# Patient Record
Sex: Female | Born: 1990 | Race: White | Hispanic: No | Marital: Single | Smoking: Former smoker
Health system: Southern US, Community
[De-identification: ages and names within clinical notes are randomized; demographics above are authoritative.]

## PROBLEM LIST (undated history)

## (undated) DIAGNOSIS — F419 Anxiety disorder, unspecified: Secondary | ICD-10-CM

## (undated) DIAGNOSIS — F909 Attention-deficit hyperactivity disorder, unspecified type: Secondary | ICD-10-CM

## (undated) DIAGNOSIS — N809 Endometriosis, unspecified: Secondary | ICD-10-CM

---
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
## 2013-02-28 ENCOUNTER — Emergency Department
Admission: EM | Admit: 2013-02-28 | Discharge: 2013-02-28 | Discharge status: Absent without leave | Payer: Self-pay | Source: Home / Self Care | Attending: Family Medicine | Admitting: Family Medicine

## 2013-02-28 ENCOUNTER — Encounter: Payer: Self-pay | Admitting: Emergency Medicine

## 2013-02-28 DIAGNOSIS — R21 Rash and other nonspecific skin eruption: Secondary | ICD-10-CM

## 2013-02-28 NOTE — ED Provider Notes (Signed)
History    CSN: [id number] Arrival date & time 02/28/13  4098  First MD Initiated Contact with Patient 02/28/13 1907     Chief Complaint  Patient presents with  . Rash      HPI Comments: Patient complains of onset of a pruritic rash on her feet about 3 to 4 weeks ago.  Original lesions have persisted, and she has gradually developed new lesions.  She has multiple other complaints, including fatigue and approximately 15 pound weight loss.  She does not feel acutely ill.  The history is provided by the patient.   History reviewed. No pertinent past medical history. History reviewed. No pertinent past surgical history. Family History  Problem Relation Age of Onset  . Thyroid disease Mother   . Hypertension Father    History  Substance Use Topics  . Smoking status: Current Every Day Smoker -- 0.50 packs/day for 2 years  . Smokeless tobacco: Not on file  . Alcohol Use: No   OB History   Grav Para Term Preterm Abortions TAB SAB Ect Mult Living                 Review of Systems  Constitutional: Positive for fever, chills, activity change, fatigue and unexpected weight change.  HENT: Negative for ear pain, congestion, mouth sores, trouble swallowing and neck stiffness.   Eyes: Positive for discharge. Negative for photophobia and redness.  Respiratory: Positive for shortness of breath. Negative for cough.   Cardiovascular: Negative.   Gastrointestinal: Positive for nausea, abdominal pain and diarrhea. Negative for vomiting and blood in stool.  Genitourinary: Positive for dysuria and frequency. Negative for hematuria, flank pain, vaginal bleeding, vaginal discharge, difficulty urinating, menstrual problem and pelvic pain.  Musculoskeletal: Positive for myalgias.       Joint stiffness  Neurological: Positive for headaches.  Hematological: Negative for adenopathy.    Allergies  Penicillins  Home Medications  No current outpatient prescriptions on file. BP 111/74  Pulse 71   Temp(Src) 98.3 F (36.8 C) (Oral)  Ht 5\' 1"  (1.549 m)  Wt 106 lb (48.081 kg)  BMI 20.04 kg/m2  SpO2 100%  LMP 01/29/2013 Physical Exam  Constitutional: She is oriented to person, place, and time. She appears well-developed and well-nourished. No distress.  HENT:  Head: Normocephalic.  Nose: Nose normal.  Mouth/Throat: Oropharynx is clear and moist.  Eyes: Conjunctivae are normal. Pupils are equal, round, and reactive to light.  Neck: Neck supple.  Cardiovascular: Normal rate, regular rhythm and normal heart sounds.   Pulmonary/Chest: Breath sounds normal.  Abdominal: Bowel sounds are normal. There is no tenderness.  Musculoskeletal: She exhibits no edema.       Feet:  Lymphadenopathy:    She has no cervical adenopathy.  Neurological: She is alert and oriented to person, place, and time.  Skin: Rash noted. Rash is maculopapular.     Patient's extremities have widely scattered non-specific appearing erythematous macules about 3mm to 5mm dia.  On the plantar surface of her left foot are two slightly raised erythematous macules about 8mm dia and 12mm dia that are tender to palpation.  On her upper anterior thighs are two lightly erythematous macules measuring about 4cm and 5cm dia as noted on diagram.  On her right posterior shoulder are two macular tender erythematous lesions about 8mm and 10mm dia.    ED Course  Procedures  none    1. Rash and nonspecific skin eruption.  Patient has a constellation of symptoms including fever/chills/sweats, 15  pound weight loss, myalgias, dizziness, fatigue, headaches, GI symptoms, etc.  Concern for possible secondary syphilis.        MDM   Because she may likely need an extensive evaluation, including skin biopsy, will refer to dermatology as soon as possible  Lattie Haw, MD 02/28/13 2023

## 2013-02-28 NOTE — ED Notes (Signed)
Red, itchy rash on feet, thighs, back x 3 weeks

## 2013-03-01 ENCOUNTER — Telehealth: Payer: Self-pay | Admitting: *Deleted

## 2014-09-14 ENCOUNTER — Emergency Department (HOSPITAL_BASED_OUTPATIENT_CLINIC_OR_DEPARTMENT_OTHER)
Admission: EM | Admit: 2014-09-14 | Discharge: 2014-09-14 | Disposition: A | Discharge status: Home / Self Care | Payer: No Typology Code available for payment source | Attending: Emergency Medicine | Admitting: Emergency Medicine

## 2014-09-14 ENCOUNTER — Encounter (HOSPITAL_BASED_OUTPATIENT_CLINIC_OR_DEPARTMENT_OTHER): Payer: Self-pay | Admitting: *Deleted

## 2014-09-14 ENCOUNTER — Emergency Department (HOSPITAL_BASED_OUTPATIENT_CLINIC_OR_DEPARTMENT_OTHER): Payer: No Typology Code available for payment source

## 2014-09-14 DIAGNOSIS — F419 Anxiety disorder, unspecified: Secondary | ICD-10-CM | POA: Insufficient documentation

## 2014-09-14 DIAGNOSIS — Z79899 Other long term (current) drug therapy: Secondary | ICD-10-CM | POA: Insufficient documentation

## 2014-09-14 DIAGNOSIS — Y998 Other external cause status: Secondary | ICD-10-CM | POA: Insufficient documentation

## 2014-09-14 DIAGNOSIS — Z720 Tobacco use: Secondary | ICD-10-CM | POA: Insufficient documentation

## 2014-09-14 DIAGNOSIS — Z880 Allergy status to penicillin: Secondary | ICD-10-CM | POA: Insufficient documentation

## 2014-09-14 DIAGNOSIS — S299XXA Unspecified injury of thorax, initial encounter: Secondary | ICD-10-CM | POA: Insufficient documentation

## 2014-09-14 DIAGNOSIS — M546 Pain in thoracic spine: Secondary | ICD-10-CM

## 2014-09-14 DIAGNOSIS — Y92410 Unspecified street and highway as the place of occurrence of the external cause: Secondary | ICD-10-CM | POA: Insufficient documentation

## 2014-09-14 DIAGNOSIS — Y9389 Activity, other specified: Secondary | ICD-10-CM | POA: Insufficient documentation

## 2014-09-14 HISTORY — DX: Anxiety disorder, unspecified: F41.9

## 2014-09-14 MED ORDER — CYCLOBENZAPRINE HCL 5 MG PO TABS: 5.0000 mg | ORAL_TABLET | Freq: Two times a day (BID) | ORAL | Status: DC | PRN
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
## 2014-09-14 NOTE — Discharge Instructions (Signed)
Back Pain, Adult Low back pain is very common. About 1 in 5 people have back pain.The cause of low back pain is rarely dangerous. The pain often gets better over time.About half of people with a sudden onset of back pain feel better in just 2 weeks. About 8 in 10 people feel better by 6 weeks.  CAUSES Some common causes of back pain include:  Strain of the muscles or ligaments supporting the spine.  Wear and tear (degeneration) of the spinal discs.  Arthritis.  Direct injury to the back. DIAGNOSIS Most of the time, the direct cause of low back pain is not known.However, back pain can be treated effectively even when the exact cause of the pain is unknown.Answering your caregiver's questions about your overall health and symptoms is one of the most accurate ways to make sure the cause of your pain is not dangerous. If your caregiver needs more information, he or she may order lab work or imaging tests (X-rays or MRIs).However, even if imaging tests show changes in your back, this usually does not require surgery. HOME CARE INSTRUCTIONS For many people, back pain returns.Since low back pain is rarely dangerous, it is often a condition that people can learn to manageon their own.   Remain active. It is stressful on the back to sit or stand in one place. Do not sit, drive, or stand in one place for more than 30 minutes at a time. Take short walks on level surfaces as soon as pain allows.Try to increase the length of time you walk each day.  Do not stay in bed.Resting more than 1 or 2 days can delay your recovery.  Do not avoid exercise or work.Your body is made to move.It is not dangerous to be active, even though your back may hurt.Your back will likely heal faster if you return to being active before your pain is gone.  Pay attention to your body when you bend and lift. Many people have less discomfortwhen lifting if they bend their knees, keep the load close to their bodies,and  avoid twisting. Often, the most comfortable positions are those that put less stress on your recovering back.  Find a comfortable position to sleep. Use a firm mattress and lie on your side with your knees slightly bent. If you lie on your back, put a pillow under your knees.  Only take over-the-counter or prescription medicines as directed by your caregiver. Over-the-counter medicines to reduce pain and inflammation are often the most helpful.Your caregiver may prescribe muscle relaxant drugs.These medicines help dull your pain so you can more quickly return to your normal activities and healthy exercise.  Put ice on the injured area.  Put ice in a plastic bag.  Place a towel between your skin and the bag.  Leave the ice on for 15-20 minutes, 03-04 times a day for the first 2 to 3 days. After that, ice and heat may be alternated to reduce pain and spasms.  Ask your caregiver about trying back exercises and gentle massage. This may be of some benefit.  Avoid feeling anxious or stressed.Stress increases muscle tension and can worsen back pain.It is important to recognize when you are anxious or stressed and learn ways to manage it.Exercise is a great option. SEEK MEDICAL CARE IF:  You have pain that is not relieved with rest or medicine.  You have pain that does not improve in 1 week.  You have new symptoms.  You are generally not feeling well. SEEK   IMMEDIATE MEDICAL CARE IF:   You have pain that radiates from your back into your legs.  You develop new bowel or bladder control problems.  You have unusual weakness or numbness in your arms or legs.  You develop nausea or vomiting.  You develop abdominal pain.  You feel faint. Document Released: 07/28/2005 Document Revised: 01/27/2012 Document Reviewed: 11/29/2013 [employer] Patient Information 2015 [employer]. This information is not intended to replace advice given to you by your health care provider. Make sure you  discuss any questions you have with your health care provider.  

## 2014-12-10 ENCOUNTER — Emergency Department (HOSPITAL_BASED_OUTPATIENT_CLINIC_OR_DEPARTMENT_OTHER)
Admission: EM | Admit: 2014-12-10 | Discharge: 2014-12-10 | Disposition: A | Discharge status: Home / Self Care | Payer: Self-pay | Attending: Emergency Medicine | Admitting: Emergency Medicine

## 2014-12-10 ENCOUNTER — Emergency Department (HOSPITAL_BASED_OUTPATIENT_CLINIC_OR_DEPARTMENT_OTHER): Payer: Self-pay

## 2014-12-10 ENCOUNTER — Encounter (HOSPITAL_BASED_OUTPATIENT_CLINIC_OR_DEPARTMENT_OTHER): Payer: Self-pay | Admitting: Emergency Medicine

## 2014-12-10 DIAGNOSIS — Z880 Allergy status to penicillin: Secondary | ICD-10-CM | POA: Insufficient documentation

## 2014-12-10 DIAGNOSIS — Z79899 Other long term (current) drug therapy: Secondary | ICD-10-CM | POA: Insufficient documentation

## 2014-12-10 DIAGNOSIS — H539 Unspecified visual disturbance: Secondary | ICD-10-CM | POA: Insufficient documentation

## 2014-12-10 DIAGNOSIS — Z720 Tobacco use: Secondary | ICD-10-CM | POA: Insufficient documentation

## 2014-12-10 DIAGNOSIS — Z3202 Encounter for pregnancy test, result negative: Secondary | ICD-10-CM | POA: Insufficient documentation

## 2014-12-10 DIAGNOSIS — M549 Dorsalgia, unspecified: Secondary | ICD-10-CM | POA: Insufficient documentation

## 2014-12-10 DIAGNOSIS — F419 Anxiety disorder, unspecified: Secondary | ICD-10-CM | POA: Insufficient documentation

## 2014-12-10 DIAGNOSIS — K529 Noninfective gastroenteritis and colitis, unspecified: Secondary | ICD-10-CM | POA: Insufficient documentation

## 2014-12-10 DIAGNOSIS — R0602 Shortness of breath: Secondary | ICD-10-CM | POA: Insufficient documentation

## 2014-12-10 DIAGNOSIS — R109 Unspecified abdominal pain: Secondary | ICD-10-CM

## 2014-12-10 HISTORY — DX: Attention-deficit hyperactivity disorder, unspecified type: F90.9

## 2014-12-10 LAB — CBC WITH DIFFERENTIAL/PLATELET
BASOS ABS: 0 10*3/uL (ref 0.0–0.1)
Basophils Relative: 0 % (ref 0–1)
Eosinophils Absolute: 0.1 10*3/uL (ref 0.0–0.7)
Eosinophils Relative: 1 % (ref 0–5)
HCT: 41.7 % (ref 36.0–46.0)
HEMOGLOBIN: 14 g/dL (ref 12.0–15.0)
LYMPHS PCT: 14 % (ref 12–46)
Lymphs Abs: 1.4 10*3/uL (ref 0.7–4.0)
MCH: 32.4 pg (ref 26.0–34.0)
MCHC: 33.6 g/dL (ref 30.0–36.0)
MCV: 96.5 fL (ref 78.0–100.0)
Monocytes Absolute: 0.4 10*3/uL (ref 0.1–1.0)
Monocytes Relative: 4 % (ref 3–12)
NEUTROS ABS: 8.2 10*3/uL — AB (ref 1.7–7.7)
Neutrophils Relative %: 81 % — ABNORMAL HIGH (ref 43–77)
Platelets: 281 10*3/uL (ref 150–400)
RBC: 4.32 MIL/uL (ref 3.87–5.11)
RDW: 12.2 % (ref 11.5–15.5)
WBC: 10.1 10*3/uL (ref 4.0–10.5)

## 2014-12-10 LAB — COMPREHENSIVE METABOLIC PANEL
ALBUMIN: 4.4 g/dL (ref 3.5–5.0)
ALT: 8 U/L — AB (ref 14–54)
AST: 22 U/L (ref 15–41)
Alkaline Phosphatase: 82 U/L (ref 38–126)
Anion gap: 7 (ref 5–15)
BUN: 16 mg/dL (ref 6–20)
CO2: 22 mmol/L (ref 22–32)
Calcium: 8.4 mg/dL — ABNORMAL LOW (ref 8.9–10.3)
Chloride: 107 mmol/L (ref 101–111)
Creatinine, Ser: 0.57 mg/dL (ref 0.44–1.00)
GFR calc non Af Amer: >60 mL/min (ref >60)
GLUCOSE: 86 mg/dL (ref 70–99)
Potassium: 4.1 mmol/L (ref 3.5–5.1)
SODIUM: 136 mmol/L (ref 135–145)
TOTAL PROTEIN: 7.7 g/dL (ref 6.5–8.1)
Total Bilirubin: 0.5 mg/dL (ref 0.3–1.2)

## 2014-12-10 LAB — CBG MONITORING, ED: Glucose-Capillary: 73 mg/dL (ref 70–99)

## 2014-12-10 LAB — LIPASE, BLOOD: Lipase: 21 U/L — ABNORMAL LOW (ref 22–51)

## 2014-12-10 LAB — PREGNANCY, URINE: Preg Test, Ur: NEGATIVE

## 2014-12-10 MED ORDER — SODIUM CHLORIDE 0.9 % IV BOLUS (SEPSIS)
1000.0000 mL | Freq: Once | INTRAVENOUS | Status: AC
  Administered 2014-12-10: 1000 mL via INTRAVENOUS

## 2014-12-10 MED ORDER — PROMETHAZINE HCL 25 MG PO TABS: 25.0000 mg | ORAL_TABLET | Freq: Four times a day (QID) | ORAL | Status: DC | PRN

## 2014-12-10 MED ORDER — ONDANSETRON HCL 4 MG/2ML IJ SOLN
4.0000 mg | Freq: Once | INTRAMUSCULAR | Status: AC
  Administered 2014-12-10: 4 mg via INTRAVENOUS
  Filled 2014-12-10: qty 2

## 2014-12-10 MED ORDER — IOHEXOL 300 MG/ML  SOLN
50.0000 mL | Freq: Once | INTRAMUSCULAR | Status: AC | PRN
Start: 2014-12-10 — End: 2014-12-10
  Administered 2014-12-10: 50 mL via ORAL

## 2014-12-10 MED ORDER — LOPERAMIDE HCL 2 MG PO TABS: 2.0000 mg | ORAL_TABLET | Freq: Four times a day (QID) | ORAL | Status: DC | PRN

## 2014-12-10 MED ORDER — FENTANYL CITRATE (PF) 100 MCG/2ML IJ SOLN
25.0000 ug | Freq: Once | INTRAMUSCULAR | Status: AC
  Administered 2014-12-10: 25 ug via INTRAVENOUS
  Filled 2014-12-10: qty 2

## 2014-12-10 MED ORDER — IOHEXOL 300 MG/ML  SOLN
100.0000 mL | Freq: Once | INTRAMUSCULAR | Status: AC | PRN
  Administered 2014-12-10: 100 mL via INTRAVENOUS

## 2014-12-10 MED ORDER — SODIUM CHLORIDE 0.9 % IV SOLN
INTRAVENOUS | Status: DC
  Administered 2014-12-10: 16:00:00 via INTRAVENOUS

## 2014-12-10 NOTE — ED Provider Notes (Signed)
CSN: [id number]     Arrival date & time 12/10/14  1442 History  This chart was scribed for Vanetta Mulders, MD by Roxy Cedar, ED Scribe. This patient was seen in room MH10/MH10 and the patient's care was started at [facility] PM.   Chief Complaint  Patient presents with  . Abdominal Pain   Patient is a 24 y.o. female presenting with abdominal pain. The history is provided by the patient. No language interpreter was used.  Abdominal Pain Pain location:  Periumbilical Pain quality: aching, cramping and sharp   Pain radiates to:  Back Pain severity:  Moderate Onset quality:  Gradual Timing:  Intermittent Chronicity:  New Relieved by:  None tried Worsened by:  Nothing tried Associated symptoms: chills, diarrhea, nausea, shortness of breath, vaginal bleeding (menstrual period) and vomiting   Associated symptoms: no chest pain, no dysuria, no fever and no sore throat    HPI Comments: Suzanne Horton is a 24 y.o. female with a PMHx of anxiety and ADHD, who presents to the Emergency Department complaining of moderate nausea onset this morning at 9PM and onset of emesis at 12PM today. Patient has had 4 episodes of emesis and one episode of diarrhea. Patient reports associated periumbilical pain that intermittently radiates to back. She describes the pain to be sharp. She currently rates her pain to be 5/10 and at worst, 10/10. She denies associated fever, hemoptysis, or hematemesis. Patient denies any sick contacts. Patient states that she began her menstrual cycle yesterday but denies current pain being associated with her cycle.  Past Medical History  Diagnosis Date  . Anxiety   . ADHD (attention deficit hyperactivity disorder)    History reviewed. No pertinent past surgical history. Family History  Problem Relation Age of Onset  . Thyroid disease Mother   . Hypertension Father    History  Substance Use Topics  . Smoking status: Current Every Day Smoker -- 0.25 packs/day for 2 years  .  Smokeless tobacco: Not on file  . Alcohol Use: Yes     Comment: "couple of shots every night"   OB History    No data available     Review of Systems  Constitutional: Positive for chills. Negative for fever.  HENT: Negative for congestion, rhinorrhea and sore throat.   Eyes: Positive for visual disturbance (blurred vision).  Respiratory: Positive for shortness of breath.   Cardiovascular: Negative for chest pain.  Gastrointestinal: Positive for nausea, vomiting, abdominal pain and diarrhea.  Genitourinary: Positive for vaginal bleeding (menstrual period). Negative for dysuria.  Musculoskeletal: Positive for back pain.  Skin: Negative for rash.  Neurological: Negative for weakness and headaches.  Hematological: Does not bruise/bleed easily.  Psychiatric/Behavioral: Negative for confusion.   Allergies  Ativan and Penicillins  Home Medications   Prior to Admission medications   Medication Sig Start Date End Date Taking? Authorizing Provider  ALPRAZolam (XANAX) 0.25 MG tablet Take 0.25 mg by mouth 2 (two) times daily as needed for anxiety.    Historical Provider, MD  citalopram (CELEXA) 20 MG tablet Take 20 mg by mouth daily.    Historical Provider, MD  cyclobenzaprine (FLEXERIL) 5 MG tablet Take 1 tablet (5 mg total) by mouth 2 (two) times daily as needed. 09/14/14   Teressa Lower, NP  loperamide (IMODIUM A-D) 2 MG tablet Take 1 tablet (2 mg total) by mouth 4 (four) times daily as needed for diarrhea or loose stools. 12/10/14   Vanetta Mulders, MD  promethazine (PHENERGAN) 25 MG tablet Take 1 tablet (  25 mg total) by mouth every 6 (six) hours as needed for nausea or vomiting. 12/10/14   Vanetta MuldersScott Jana Swartzlander, MD   Triage Vitals: BP 114/65 mmHg  Pulse 70  Temp(Src) 97.8 F (36.6 C) (Oral)  Resp 16  Ht 5\' 2"  (1.575 m)  Wt 128 lb (58.06 kg)  BMI 23.41 kg/m2  SpO2 100%  LMP 12/09/2014 (Exact Date)  Physical Exam  Constitutional: She is oriented to person, place, and time. She appears  well-developed and well-nourished.  HENT:  Head: Normocephalic and atraumatic.  Mouth/Throat: Oropharynx is clear and moist. No oropharyngeal exudate.  Mucous membranes moist but slightly dry  Eyes: Pupils are equal, round, and reactive to light.  Cardiovascular: Normal rate, regular rhythm and normal heart sounds.   Pulmonary/Chest: Effort normal and breath sounds normal. No respiratory distress.  Abdominal: There is tenderness. There is no guarding.  Decreased bowel sounds; diffuse tenderness  Musculoskeletal: Normal range of motion.  Neurological: She is alert and oriented to person, place, and time. No cranial nerve deficit. Coordination normal.  Skin: No rash noted.  Cap refill to both toes is <1 sec  Psychiatric: She has a normal mood and affect. Her behavior is normal.  Nursing note and vitals reviewed.  ED Course  Procedures (including critical care time)  DIAGNOSTIC STUDIES: Oxygen Saturation is 100% on RA, normal by my interpretation.    COORDINATION OF CARE: 3:30 PM- Discussed plans to order diagnostic CT imaging of abdomen and lab work. Pt advised of plan for treatment and pt agrees.  Labs Review Labs Reviewed  COMPREHENSIVE METABOLIC PANEL - Abnormal; Notable for the following:    Calcium 8.4 (*)    ALT 8 (*)    All other components within normal limits  LIPASE, BLOOD - Abnormal; Notable for the following:    Lipase 21 (*)    All other components within normal limits  CBC WITH DIFFERENTIAL/PLATELET - Abnormal; Notable for the following:    Neutrophils Relative % 81 (*)    Neutro Abs 8.2 (*)    All other components within normal limits  PREGNANCY, URINE  CBG MONITORING, ED   Results for orders placed or performed during the hospital encounter of 12/10/14  Comprehensive metabolic panel  Result Value Ref Range   Sodium 136 135 - 145 mmol/L   Potassium 4.1 3.5 - 5.1 mmol/L   Chloride 107 101 - 111 mmol/L   CO2 22 22 - 32 mmol/L   Glucose, Bld 86 70 - 99 mg/dL    BUN 16 6 - 20 mg/dL   Creatinine, Ser 4.090.57 0.44 - 1.00 mg/dL   Calcium 8.4 (L) 8.9 - 10.3 mg/dL   Total Protein 7.7 6.5 - 8.1 g/dL   Albumin 4.4 3.5 - 5.0 g/dL   AST 22 15 - 41 U/L   ALT 8 (L) 14 - 54 U/L   Alkaline Phosphatase 82 38 - 126 U/L   Total Bilirubin 0.5 0.3 - 1.2 mg/dL   GFR calc non Af Amer >60 >60 mL/min   GFR calc Af Amer >60 >60 mL/min   Anion gap 7 5 - 15  Lipase, blood  Result Value Ref Range   Lipase 21 (L) 22 - 51 U/L  CBC with Differential/Platelet  Result Value Ref Range   WBC 10.1 4.0 - 10.5 K/uL   RBC 4.32 3.87 - 5.11 MIL/uL   Hemoglobin 14.0 12.0 - 15.0 g/dL   HCT 81.141.7 91.436.0 - 78.246.0 %   MCV 96.5 78.0 - 100.0 fL  MCH 32.4 26.0 - 34.0 pg   MCHC 33.6 30.0 - 36.0 g/dL   RDW 16.1 09.6 - 04.5 %   Platelets 281 150 - 400 K/uL   Neutrophils Relative % 81 (H) 43 - 77 %   Neutro Abs 8.2 (H) 1.7 - 7.7 K/uL   Lymphocytes Relative 14 12 - 46 %   Lymphs Abs 1.4 0.7 - 4.0 K/uL   Monocytes Relative 4 3 - 12 %   Monocytes Absolute 0.4 0.1 - 1.0 K/uL   Eosinophils Relative 1 0 - 5 %   Eosinophils Absolute 0.1 0.0 - 0.7 K/uL   Basophils Relative 0 0 - 1 %   Basophils Absolute 0.0 0.0 - 0.1 K/uL  Pregnancy, urine  Result Value Ref Range   Preg Test, Ur NEGATIVE NEGATIVE  CBG monitoring, ED  Result Value Ref Range   Glucose-Capillary 73 70 - 99 mg/dL    Imaging Review Ct Abdomen Pelvis W Contrast  12/10/2014   CLINICAL DATA:  Nausea/vomiting, diarrhea, periumbilical pain radiating to back  EXAM: CT ABDOMEN AND PELVIS WITH CONTRAST  TECHNIQUE: Multidetector CT imaging of the abdomen and pelvis was performed using the standard protocol following bolus administration of intravenous contrast.  CONTRAST:  OMNIPAQUE IOHEXOL 300 MG/ML  SOLN  COMPARISON:  None.  FINDINGS: Lower chest:  Lung bases are clear.  Hepatobiliary: Liver is within normal limits. No suspicious/enhancing hepatic lesions.  Gallbladder is unremarkable. No intrahepatic or extrahepatic ductal  dilatation.  Pancreas: Within normal limits.  Spleen: Within normal limits.  Adrenals/Urinary Tract: Adrenal glands within normal limits.  Kidneys within normal limits.  No hydronephrosis.  Bladder is mildly thick-walled.  Stomach/Bowel: Stomach is within normal limits.  No evidence of bowel obstruction.  Normal appendix.  Vascular/Lymphatic: No evidence of abdominal aortic aneurysm.  Circumaortic left renal vein.  No suspicious abdominopelvic lymphadenopathy.  Reproductive: Uterus is within normal limits.  Bilateral ovaries are within normal limits.  Other: Trace pelvic ascites.  Musculoskeletal: Visualized osseous structures are within normal limits.  IMPRESSION: No evidence of bowel obstruction.  Normal appendix.  Mildly thick-walled bladder, correlate for cystitis.  Otherwise, no CT findings to account for the patient's abdominal pain.  Trace pelvic ascites, likely physiologic.   Electronically Signed   By: Charline Bills M.D.   On: 12/10/2014 18:17     EKG Interpretation None     MDM   Final diagnoses:  Abdominal pain  Gastroenteritis    Patient symptoms most likely consistent with a viral gastroenteritis. CT scan without any significant abnormalities. No significant lab abnormalities. Will treat symptomatically. Work note provided.  I personally performed the services described in this documentation, which was scribed in my presence. The recorded information has been reviewed and is accurate.    Vanetta Mulders, MD 12/10/14 [phone]

## 2014-12-10 NOTE — Discharge Instructions (Signed)
Symptoms most likely consistent with a gastroenteritis. CT scan was negative for any significant abdominal problems. Take the Phenergan as needed for nausea and vomiting. Take Imodium right ear as needed for diarrhea. Would expect you to be improving some and 24 hours. Return for any new or worse symptoms. Work note provided.

## 2014-12-10 NOTE — ED Notes (Addendum)
Patient reports vomiting which began today around 0900.  Reports she has "low blood sugar" and she feels weak due to so much vomiting.  Reports she hasn't eaten anything today. Reports lower mid abdominal pain.  Patient reports diarrhea which began an hour ago.

## 2014-12-14 ENCOUNTER — Encounter (HOSPITAL_BASED_OUTPATIENT_CLINIC_OR_DEPARTMENT_OTHER): Payer: Self-pay | Admitting: Emergency Medicine

## 2014-12-14 ENCOUNTER — Emergency Department (HOSPITAL_BASED_OUTPATIENT_CLINIC_OR_DEPARTMENT_OTHER)
Admission: EM | Admit: 2014-12-14 | Discharge: 2014-12-14 | Disposition: A | Discharge status: Home / Self Care | Payer: Self-pay | Attending: Emergency Medicine | Admitting: Emergency Medicine

## 2014-12-14 DIAGNOSIS — R1084 Generalized abdominal pain: Secondary | ICD-10-CM | POA: Insufficient documentation

## 2014-12-14 DIAGNOSIS — R112 Nausea with vomiting, unspecified: Secondary | ICD-10-CM | POA: Insufficient documentation

## 2014-12-14 DIAGNOSIS — Z880 Allergy status to penicillin: Secondary | ICD-10-CM | POA: Insufficient documentation

## 2014-12-14 DIAGNOSIS — Z79899 Other long term (current) drug therapy: Secondary | ICD-10-CM | POA: Insufficient documentation

## 2014-12-14 DIAGNOSIS — F419 Anxiety disorder, unspecified: Secondary | ICD-10-CM | POA: Insufficient documentation

## 2014-12-14 DIAGNOSIS — Z3202 Encounter for pregnancy test, result negative: Secondary | ICD-10-CM | POA: Insufficient documentation

## 2014-12-14 DIAGNOSIS — R197 Diarrhea, unspecified: Secondary | ICD-10-CM | POA: Insufficient documentation

## 2014-12-14 DIAGNOSIS — Z720 Tobacco use: Secondary | ICD-10-CM | POA: Insufficient documentation

## 2014-12-14 LAB — BASIC METABOLIC PANEL
Anion gap: 7 (ref 5–15)
BUN: 11 mg/dL (ref 6–20)
CALCIUM: 8.8 mg/dL — AB (ref 8.9–10.3)
CO2: 25 mmol/L (ref 22–32)
CREATININE: 0.55 mg/dL (ref 0.44–1.00)
Chloride: 104 mmol/L (ref 101–111)
GFR calc Af Amer: >60 mL/min (ref >60)
Glucose, Bld: 89 mg/dL (ref 70–99)
Potassium: 4.5 mmol/L (ref 3.5–5.1)
Sodium: 136 mmol/L (ref 135–145)

## 2014-12-14 LAB — CBC WITH DIFFERENTIAL/PLATELET
Basophils Absolute: 0 10*3/uL (ref 0.0–0.1)
Basophils Relative: 1 % (ref 0–1)
EOS PCT: 2 % (ref 0–5)
Eosinophils Absolute: 0.1 10*3/uL (ref 0.0–0.7)
HCT: 40.3 % (ref 36.0–46.0)
Hemoglobin: 13.5 g/dL (ref 12.0–15.0)
LYMPHS ABS: 1.3 10*3/uL (ref 0.7–4.0)
Lymphocytes Relative: 26 % (ref 12–46)
MCH: 32.4 pg (ref 26.0–34.0)
MCHC: 33.5 g/dL (ref 30.0–36.0)
MCV: 96.6 fL (ref 78.0–100.0)
MONOS PCT: 6 % (ref 3–12)
Monocytes Absolute: 0.3 10*3/uL (ref 0.1–1.0)
Neutro Abs: 3.4 10*3/uL (ref 1.7–7.7)
Neutrophils Relative %: 65 % (ref 43–77)
Platelets: 289 10*3/uL (ref 150–400)
RBC: 4.17 MIL/uL (ref 3.87–5.11)
RDW: 12.1 % (ref 11.5–15.5)
WBC: 5.1 10*3/uL (ref 4.0–10.5)

## 2014-12-14 LAB — URINALYSIS, ROUTINE W REFLEX MICROSCOPIC
BILIRUBIN URINE: NEGATIVE
Glucose, UA: NEGATIVE mg/dL
HGB URINE DIPSTICK: NEGATIVE
Ketones, ur: NEGATIVE mg/dL
Leukocytes, UA: NEGATIVE
Nitrite: NEGATIVE
PROTEIN: NEGATIVE mg/dL
Specific Gravity, Urine: 1.009 (ref 1.005–1.030)
UROBILINOGEN UA: 0.2 mg/dL (ref 0.0–1.0)
pH: 7.5 (ref 5.0–8.0)

## 2014-12-14 LAB — RAPID URINE DRUG SCREEN, HOSP PERFORMED
Amphetamines: NOT DETECTED
Barbiturates: NOT DETECTED
Benzodiazepines: NOT DETECTED
Cocaine: NOT DETECTED
Opiates: NOT DETECTED
Tetrahydrocannabinol: POSITIVE — AB

## 2014-12-14 LAB — CBG MONITORING, ED: Glucose-Capillary: 88 mg/dL (ref 70–99)

## 2014-12-14 LAB — PREGNANCY, URINE: Preg Test, Ur: NEGATIVE

## 2014-12-14 MED ORDER — ONDANSETRON HCL 4 MG/2ML IJ SOLN
4.0000 mg | Freq: Once | INTRAMUSCULAR | Status: AC
  Administered 2014-12-14: 4 mg via INTRAVENOUS
  Filled 2014-12-14: qty 2

## 2014-12-14 MED ORDER — PROCHLORPERAZINE 25 MG RE SUPP
25.0000 mg | Freq: Two times a day (BID) | RECTAL | Status: DC | PRN
Start: 2014-12-14 — End: 2021-02-28

## 2014-12-14 MED ORDER — SODIUM CHLORIDE 0.9 % IV BOLUS (SEPSIS)
1000.0000 mL | Freq: Once | INTRAVENOUS | Status: AC
  Administered 2014-12-14: 1000 mL via INTRAVENOUS

## 2014-12-14 NOTE — Discharge Instructions (Signed)

## 2014-12-14 NOTE — ED Notes (Addendum)
Pt states she feels like she is having a panic attack.  Pt gets out of bed and paces in room.  No acute distress noted.  Dr. Blinda LeatherwoodPollina notifed.  VSS.

## 2014-12-14 NOTE — ED Notes (Signed)
Pt having emesis for four days.  No diarrhea.  Pt seen here on the 1st.  Pt states symptoms improved but then returned today.  No fever.  Some chills.  No dysuria.  Last oral intake this am, water.  Last night spanish rice.  Pt having some abdominal pain around umbilicus.

## 2014-12-14 NOTE — ED Provider Notes (Signed)
CSN: [id number]     Arrival date & time 12/14/14  1002 History   None    Chief Complaint  Patient presents with  . Abdominal Pain     (Consider location/radiation/quality/duration/timing/severity/associated sxs/prior Treatment) HPI Comments: Patient presents to the ER for evaluation of nausea, vomiting and diarrhea. Patient reports that she first became ill 5 days ago. She was seen in the ER at that time. She reports that she initially had diarrhea, nausea and vomiting. After 2 days symptoms resolved but in the last 24 hours started having nausea and vomiting again. Patient reports diffuse abdominal cramping. She has had chills but hasn't taken her temperature.  Patient is a 24 y.o. female presenting with abdominal pain.  Abdominal Pain Associated symptoms: diarrhea, nausea and vomiting     Past Medical History  Diagnosis Date  . Anxiety   . ADHD (attention deficit hyperactivity disorder)    No past surgical history on file. Family History  Problem Relation Age of Onset  . Thyroid disease Mother   . Hypertension Father    History  Substance Use Topics  . Smoking status: Current Every Day Smoker -- 0.25 packs/day for 2 years  . Smokeless tobacco: Not on file  . Alcohol Use: Yes     Comment: "couple of shots every night"   OB History    No data available     Review of Systems  Gastrointestinal: Positive for nausea, vomiting, abdominal pain and diarrhea.  All other systems reviewed and are negative.     Allergies  Ativan and Penicillins  Home Medications   Prior to Admission medications   Medication Sig Start Date End Date Taking? Authorizing Provider  ALPRAZolam (XANAX) 0.25 MG tablet Take 0.25 mg by mouth 2 (two) times daily as needed for anxiety.    Historical Provider, MD  citalopram (CELEXA) 20 MG tablet Take 20 mg by mouth daily.    Historical Provider, MD  loperamide (IMODIUM A-D) 2 MG tablet Take 1 tablet (2 mg total) by mouth 4 (four) times daily as needed  for diarrhea or loose stools. 12/10/14   Vanetta MuldersScott Zackowski, MD  promethazine (PHENERGAN) 25 MG tablet Take 1 tablet (25 mg total) by mouth every 6 (six) hours as needed for nausea or vomiting. 12/10/14   Vanetta MuldersScott Zackowski, MD   BP 126/77 mmHg  Pulse 93  Temp(Src) 98 F (36.7 C)  Resp 18  Ht 5\' 2"  (1.575 m)  Wt 128 lb (58.06 kg)  BMI 23.41 kg/m2  SpO2 100%  LMP 12/09/2014 (Exact Date) Physical Exam  Constitutional: She is oriented to person, place, and time. She appears well-developed and well-nourished. No distress.  HENT:  Head: Normocephalic and atraumatic.  Right Ear: Hearing normal.  Left Ear: Hearing normal.  Nose: Nose normal.  Mouth/Throat: Oropharynx is clear and moist and mucous membranes are normal.  Eyes: Conjunctivae and EOM are normal. Pupils are equal, round, and reactive to light.  Neck: Normal range of motion. Neck supple.  Cardiovascular: Regular rhythm, S1 normal and S2 normal.  Exam reveals no gallop and no friction rub.   No murmur heard. Pulmonary/Chest: Effort normal and breath sounds normal. No respiratory distress. She exhibits no tenderness.  Abdominal: Soft. Normal appearance and bowel sounds are normal. There is no hepatosplenomegaly. There is generalized tenderness. There is no rebound, no guarding, no tenderness at McBurney's point and negative Murphy's sign. No hernia.  Musculoskeletal: Normal range of motion.  Neurological: She is alert and oriented to person, place, and time.  She has normal strength. No cranial nerve deficit or sensory deficit. Coordination normal. GCS eye subscore is 4. GCS verbal subscore is 5. GCS motor subscore is 6.  Skin: Skin is warm, dry and intact. No rash noted. No cyanosis.  Psychiatric: She has a normal mood and affect. Her speech is normal and behavior is normal. Thought content normal.  Nursing note and vitals reviewed.   ED Course  Procedures (including critical care time) Labs Review Labs Reviewed - No data to  display  Imaging Review No results found.   EKG Interpretation None      MDM   Final diagnoses:  None   vomiting  Patient presents to the ER for evaluation of nausea and vomiting. Patient was seen 5 days ago with similar. She had a thorough workup at that time including CT scan. All tests were negative. Patient has a benign examination today. Basic labs are unremarkable. Drug screen positive for THC. Patient was counseled that she could have recurrent episodes of nausea and vomiting if she is engaging in heavy marijuana use. She was counseled to avoid marijuana. Will continue symptomatic treatment for nausea or vomiting.  Gilda Creasehristopher J Toiya Morrish, MD 12/14/14 1141

## 2015-03-29 ENCOUNTER — Emergency Department (HOSPITAL_BASED_OUTPATIENT_CLINIC_OR_DEPARTMENT_OTHER)
Admission: EM | Admit: 2015-03-29 | Discharge: 2015-03-29 | Disposition: A | Discharge status: Home / Self Care | Payer: Self-pay | Attending: Emergency Medicine | Admitting: Emergency Medicine

## 2015-03-29 ENCOUNTER — Encounter (HOSPITAL_BASED_OUTPATIENT_CLINIC_OR_DEPARTMENT_OTHER): Payer: Self-pay | Admitting: *Deleted

## 2015-03-29 DIAGNOSIS — Z87448 Personal history of other diseases of urinary system: Secondary | ICD-10-CM | POA: Insufficient documentation

## 2015-03-29 DIAGNOSIS — Z720 Tobacco use: Secondary | ICD-10-CM | POA: Insufficient documentation

## 2015-03-29 DIAGNOSIS — R109 Unspecified abdominal pain: Secondary | ICD-10-CM

## 2015-03-29 DIAGNOSIS — Z3202 Encounter for pregnancy test, result negative: Secondary | ICD-10-CM | POA: Insufficient documentation

## 2015-03-29 DIAGNOSIS — R1084 Generalized abdominal pain: Secondary | ICD-10-CM | POA: Insufficient documentation

## 2015-03-29 DIAGNOSIS — F419 Anxiety disorder, unspecified: Secondary | ICD-10-CM | POA: Insufficient documentation

## 2015-03-29 DIAGNOSIS — Z79899 Other long term (current) drug therapy: Secondary | ICD-10-CM | POA: Insufficient documentation

## 2015-03-29 DIAGNOSIS — Z880 Allergy status to penicillin: Secondary | ICD-10-CM | POA: Insufficient documentation

## 2015-03-29 HISTORY — DX: Endometriosis, unspecified: N80.9

## 2015-03-29 LAB — URINALYSIS, ROUTINE W REFLEX MICROSCOPIC
BILIRUBIN URINE: NEGATIVE
GLUCOSE, UA: NEGATIVE mg/dL
Ketones, ur: NEGATIVE mg/dL
Nitrite: NEGATIVE
Protein, ur: NEGATIVE mg/dL
Specific Gravity, Urine: 1.02 (ref 1.005–1.030)
UROBILINOGEN UA: 0.2 mg/dL (ref 0.0–1.0)
pH: 7 (ref 5.0–8.0)

## 2015-03-29 LAB — URINE MICROSCOPIC-ADD ON

## 2015-03-29 LAB — PREGNANCY, URINE: Preg Test, Ur: NEGATIVE

## 2015-03-29 MED ORDER — KETOROLAC TROMETHAMINE 60 MG/2ML IM SOLN
60.0000 mg | Freq: Once | INTRAMUSCULAR | Status: AC
  Administered 2015-03-29: 60 mg via INTRAMUSCULAR
  Filled 2015-03-29: qty 2

## 2015-03-29 MED ORDER — HYDROMORPHONE HCL 1 MG/ML IJ SOLN
1.0000 mg | Freq: Once | INTRAMUSCULAR | Status: AC
Start: 2015-03-29 — End: 2015-03-29
  Administered 2015-03-29: 1 mg via INTRAMUSCULAR
  Filled 2015-03-29: qty 1

## 2015-03-29 MED ORDER — IBUPROFEN 800 MG PO TABS: 800.0000 mg | ORAL_TABLET | Freq: Three times a day (TID) | ORAL | Status: DC

## 2015-03-29 NOTE — ED Notes (Signed)
Pt here with severe abdominal cramping, pt with hx of endometriosis.  Pt states that she began her period today and the pain is severe.

## 2015-03-29 NOTE — ED Provider Notes (Signed)
CSN: [id number]     Arrival date & time 03/29/15  2103 History  This chart was scribed for Elwin Mocha, MD by Phillis Haggis, ED Scribe. This patient was seen in room MH10/MH10 and patient care was started at 9:46 PM.   Chief Complaint  Patient presents with  . Abdominal Cramping   Patient is a 24 y.o. female presenting with cramps. The history is provided by the patient. No language interpreter was used.  Abdominal Cramping This is a new problem. The problem occurs constantly. The problem has been gradually worsening. Associated symptoms include abdominal pain. She has tried nothing for the symptoms.   HPI Comments: Suzanne Horton is a 24 y.o. female with hx of endometriosis who presents to the Emergency Department complaining of severe, radiating, lower abdominal cramping onset today. States that the pain radiates to her legs and it feels like "someone is ripping my legs off." Reports that her period is heavier this cycle. She reports trying ibuprofen this morning to no relief. She states that she was previously on birth control for the endometriosis but has been unable to afford it since she was 60.  Reports hx of similar symptoms and past visits to the ED but states that it has never been this severe. Denies vomiting or diarrhea.   Past Medical History  Diagnosis Date  . Anxiety   . ADHD (attention deficit hyperactivity disorder)   . Endometriosis    History reviewed. No pertinent past surgical history. Family History  Problem Relation Age of Onset  . Thyroid disease Mother   . Hypertension Father    Social History  Substance Use Topics  . Smoking status: Current Every Day Smoker -- 0.25 packs/day for 2 years  . Smokeless tobacco: None  . Alcohol Use: Yes     Comment: "couple of shots every night"   OB History    No data available     Review of Systems  Gastrointestinal: Positive for abdominal pain.  All other systems reviewed and are negative.     Allergies  Ativan  and Penicillins  Home Medications   Prior to Admission medications   Medication Sig Start Date End Date Taking? Authorizing Provider  ALPRAZolam (XANAX) 0.25 MG tablet Take 0.25 mg by mouth 2 (two) times daily as needed for anxiety.    Historical Provider, MD  citalopram (CELEXA) 20 MG tablet Take 20 mg by mouth daily.    Historical Provider, MD  loperamide (IMODIUM A-D) 2 MG tablet Take 1 tablet (2 mg total) by mouth 4 (four) times daily as needed for diarrhea or loose stools. 12/10/14   Vanetta Mulders, MD  prochlorperazine (COMPAZINE) 25 MG suppository Place 1 suppository (25 mg total) rectally every 12 (twelve) hours as needed for nausea or vomiting. 12/14/14   Gilda Crease, MD  promethazine (PHENERGAN) 25 MG tablet Take 1 tablet (25 mg total) by mouth every 6 (six) hours as needed for nausea or vomiting. 12/10/14   Vanetta Mulders, MD   BP 142/96 mmHg  Pulse 80  Temp(Src) 98.4 F (36.9 C) (Oral)  Resp 18  Ht  (1.549 m)  Wt 135 lb (61.236 kg)  BMI 25.52 kg/m2  SpO2 100%  LMP 03/29/2015 Physical Exam  Constitutional: She is oriented to person, place, and time. She appears well-developed and well-nourished. No distress.  HENT:  Head: Normocephalic and atraumatic.  Mouth/Throat: Oropharynx is clear and moist.  Eyes: EOM are normal. Pupils are equal, round, and reactive to light.  Neck: Normal  range of motion. Neck supple.  Cardiovascular: Normal rate and regular rhythm.  Exam reveals no friction rub.   No murmur heard. Pulmonary/Chest: Effort normal and breath sounds normal. No respiratory distress. She has no wheezes. She has no rales.  Abdominal: Soft. She exhibits no distension. There is tenderness (mild, diffuse, lower). There is no rebound.  Musculoskeletal: Normal range of motion. She exhibits no edema.  Neurological: She is alert and oriented to person, place, and time.  Skin: No rash noted. She is not diaphoretic.  Nursing note and vitals reviewed.   ED Course   Procedures (including critical care time) DIAGNOSTIC STUDIES: Oxygen Saturation is 100% on RA, normal by my interpretation.    COORDINATION OF CARE: 9:49 PM-Discussed treatment plan which includes Toradol and Dilaudid shots with UA with pt at bedside and pt agreed to plan. Pt will get her boyfriend to pick her up.   Labs Review Labs Reviewed  URINALYSIS, ROUTINE W REFLEX MICROSCOPIC (NOT AT [facility]) - Abnormal; Notable for the following:    APPearance CLOUDY (*)    Hgb urine dipstick LARGE (*)    Leukocytes, UA MODERATE (*)    All other components within normal limits  URINE MICROSCOPIC-ADD ON - Abnormal; Notable for the following:    Squamous Epithelial / LPF FEW (*)    All other components within normal limits  PREGNANCY, URINE    Imaging Review No results found.    EKG Interpretation None      MDM   Final diagnoses:  Abdominal cramping    89F with hx of endometriosis with abdominal pain. She normally has abdominal pain with menstrual cycles due to her endometriosis. Antral cycle began today. She's had vaginal bleeding typical menstruation. Afebrile vital signs are stable here. She has some diffuse lower abdominal pain. Seems similar to her endometriosis. No rebound or guarding, no need for scan at this time. Denies urinary symptoms. Given Toradol and Dilaudid with resolution of pain. Urine looks like UTI, but being on her menstrual cycle, we'll not treat as it is likely contaminated. Feeling better, stable for discharge.   I personally performed the services described in this documentation, which was scribed in my presence. The recorded information has been reviewed and is accurate.     Elwin Mocha, MD 03/29/15 [phone]

## 2015-03-29 NOTE — Discharge Instructions (Signed)
Abdominal Pain, Women °Abdominal (stomach, pelvic, or belly) pain can be caused by many things. It is important to tell your doctor: °· The location of the pain. °· Does it come and go or is it present all the time? °· Are there things that start the pain (eating certain foods, exercise)? °· Are there other symptoms associated with the pain (fever, nausea, vomiting, diarrhea)? °All of this is helpful to know when trying to find the cause of the pain. °CAUSES  °· Stomach: virus or bacteria infection, or ulcer. °· Intestine: appendicitis (inflamed appendix), regional ileitis (Crohn's disease), ulcerative colitis (inflamed colon), irritable bowel syndrome, diverticulitis (inflamed diverticulum of the colon), or cancer of the stomach or intestine. °· Gallbladder disease or stones in the gallbladder. °· Kidney disease, kidney stones, or infection. °· Pancreas infection or cancer. °· Fibromyalgia (pain disorder). °· Diseases of the female organs: °¨ Uterus: fibroid (non-cancerous) tumors or infection. °¨ Fallopian tubes: infection or tubal pregnancy. °¨ Ovary: cysts or tumors. °¨ Pelvic adhesions (scar tissue). °¨ Endometriosis (uterus lining tissue growing in the pelvis and on the pelvic organs). °¨ Pelvic congestion syndrome (female organs filling up with blood just before the menstrual period). °¨ Pain with the menstrual period. °¨ Pain with ovulation (producing an egg). °¨ Pain with an IUD (intrauterine device, birth control) in the uterus. °¨ Cancer of the female organs. °· Functional pain (pain not caused by a disease, may improve without treatment). °· Psychological pain. °· Depression. °DIAGNOSIS  °Your doctor will decide the seriousness of your pain by doing an examination. °· Blood tests. °· X-rays. °· Ultrasound. °· CT scan (computed tomography, special type of X-ray). °· MRI (magnetic resonance imaging). °· Cultures, for infection. °· Barium enema (dye inserted in the large intestine, to better view it with  X-rays). °· Colonoscopy (looking in intestine with a lighted tube). °· Laparoscopy (minor surgery, looking in abdomen with a lighted tube). °· Major abdominal exploratory surgery (looking in abdomen with a large incision). °TREATMENT  °The treatment will depend on the cause of the pain.  °· Many cases can be observed and treated at home. °· Over-the-counter medicines recommended by your caregiver. °· Prescription medicine. °· Antibiotics, for infection. °· Birth control pills, for painful periods or for ovulation pain. °· Hormone treatment, for endometriosis. °· Nerve blocking injections. °· Physical therapy. °· Antidepressants. °· Counseling with a psychologist or psychiatrist. °· Minor or major surgery. °HOME CARE INSTRUCTIONS  °· Do not take laxatives, unless directed by your caregiver. °· Take over-the-counter pain medicine only if ordered by your caregiver. Do not take aspirin because it can cause an upset stomach or bleeding. °· Try a clear liquid diet (broth or water) as ordered by your caregiver. Slowly move to a bland diet, as tolerated, if the pain is related to the stomach or intestine. °· Have a thermometer and take your temperature several times a day, and record it. °· Bed rest and sleep, if it helps the pain. °· Avoid sexual intercourse, if it causes pain. °· Avoid stressful situations. °· Keep your follow-up appointments and tests, as your caregiver orders. °· If the pain does not go away with medicine or surgery, you may try: °¨ Acupuncture. °¨ Relaxation exercises (yoga, meditation). °¨ Group therapy. °¨ Counseling. °SEEK MEDICAL CARE IF:  °· You notice certain foods cause stomach pain. °· Your home care treatment is not helping your pain. °· You need stronger pain medicine. °· You want your IUD removed. °· You feel faint or   lightheaded. °· You develop nausea and vomiting. °· You develop a rash. °· You are having side effects or an allergy to your medicine. °SEEK IMMEDIATE MEDICAL CARE IF:  °· Your  pain does not go away or gets worse. °· You have a fever. °· Your pain is felt only in portions of the abdomen. The right side could possibly be appendicitis. The left lower portion of the abdomen could be colitis or diverticulitis. °· You are passing blood in your stools (bright red or black tarry stools, with or without vomiting). °· You have blood in your urine. °· You develop chills, with or without a fever. °· You pass out. °MAKE SURE YOU:  °· Understand these instructions. °· Will watch your condition. °· Will get help right away if you are not doing well or get worse. °Document Released: 05/25/2007 Document Revised: 12/12/2013 Document Reviewed: 06/14/2009 °Exit[employer]® Patient Information ©2015 [employer]. This information is not intended to replace advice given to you by your health care provider. Make sure you discuss any questions you have with your health care provider. ° °

## 2019-01-11 DIAGNOSIS — Z8742 Personal history of other diseases of the female genital tract: Secondary | ICD-10-CM | POA: Insufficient documentation

## 2019-01-11 DIAGNOSIS — F331 Major depressive disorder, recurrent, moderate: Secondary | ICD-10-CM | POA: Insufficient documentation

## 2019-01-11 DIAGNOSIS — F411 Generalized anxiety disorder: Secondary | ICD-10-CM | POA: Insufficient documentation

## 2019-01-11 DIAGNOSIS — E663 Overweight: Secondary | ICD-10-CM | POA: Insufficient documentation

## 2019-12-15 DIAGNOSIS — R87612 Low grade squamous intraepithelial lesion on cytologic smear of cervix (LGSIL): Secondary | ICD-10-CM | POA: Insufficient documentation

## 2019-12-15 DIAGNOSIS — F419 Anxiety disorder, unspecified: Secondary | ICD-10-CM | POA: Insufficient documentation

## 2020-02-05 DIAGNOSIS — B951 Streptococcus, group B, as the cause of diseases classified elsewhere: Secondary | ICD-10-CM | POA: Insufficient documentation

## 2021-02-28 ENCOUNTER — Encounter (HOSPITAL_COMMUNITY): Payer: Self-pay | Admitting: *Deleted

## 2021-02-28 ENCOUNTER — Emergency Department (HOSPITAL_COMMUNITY): Payer: Medicaid Other

## 2021-02-28 ENCOUNTER — Observation Stay (HOSPITAL_COMMUNITY)
Admission: EM | Admit: 2021-02-28 | Discharge: 2021-03-01 | Disposition: A | Discharge status: Home / Self Care | Payer: Medicaid Other | Attending: General Surgery | Admitting: General Surgery

## 2021-02-28 ENCOUNTER — Other Ambulatory Visit: Payer: Self-pay

## 2021-02-28 DIAGNOSIS — Z20822 Contact with and (suspected) exposure to covid-19: Secondary | ICD-10-CM | POA: Insufficient documentation

## 2021-02-28 DIAGNOSIS — K3580 Unspecified acute appendicitis: Secondary | ICD-10-CM

## 2021-02-28 DIAGNOSIS — Z87891 Personal history of nicotine dependence: Secondary | ICD-10-CM | POA: Diagnosis not present

## 2021-02-28 DIAGNOSIS — K353 Acute appendicitis with localized peritonitis, without perforation or gangrene: Principal | ICD-10-CM | POA: Insufficient documentation

## 2021-02-28 DIAGNOSIS — R1031 Right lower quadrant pain: Secondary | ICD-10-CM | POA: Diagnosis present

## 2021-02-28 LAB — URINALYSIS, ROUTINE W REFLEX MICROSCOPIC
Bacteria, UA: NONE SEEN
Bilirubin Urine: NEGATIVE
Glucose, UA: NEGATIVE mg/dL
Hgb urine dipstick: NEGATIVE
Ketones, ur: 5 mg/dL — AB
Nitrite: NEGATIVE
Protein, ur: NEGATIVE mg/dL
Specific Gravity, Urine: 1.013 (ref 1.005–1.030)
pH: 8 (ref 5.0–8.0)

## 2021-02-28 LAB — CBC WITH DIFFERENTIAL/PLATELET
Abs Immature Granulocytes: 0.07 10*3/uL (ref 0.00–0.07)
Basophils Absolute: 0 10*3/uL (ref 0.0–0.1)
Basophils Relative: 0 %
Eosinophils Absolute: 0 10*3/uL (ref 0.0–0.5)
Eosinophils Relative: 0 %
HCT: 39.2 % (ref 36.0–46.0)
Hemoglobin: 13 g/dL (ref 12.0–15.0)
Immature Granulocytes: 0 %
Lymphocytes Relative: 8 %
Lymphs Abs: 1.3 10*3/uL (ref 0.7–4.0)
MCH: 31 pg (ref 26.0–34.0)
MCHC: 33.2 g/dL (ref 30.0–36.0)
MCV: 93.6 fL (ref 80.0–100.0)
Monocytes Absolute: 0.4 10*3/uL (ref 0.1–1.0)
Monocytes Relative: 3 %
Neutro Abs: 14.3 10*3/uL — ABNORMAL HIGH (ref 1.7–7.7)
Neutrophils Relative %: 89 %
Platelets: 238 10*3/uL (ref 150–400)
RBC: 4.19 MIL/uL (ref 3.87–5.11)
RDW: 13 % (ref 11.5–15.5)
WBC: 16.2 10*3/uL — ABNORMAL HIGH (ref 4.0–10.5)
nRBC: 0 % (ref 0.0–0.2)

## 2021-02-28 LAB — COMPREHENSIVE METABOLIC PANEL
ALT: 10 U/L (ref 0–44)
AST: 18 U/L (ref 15–41)
Albumin: 4.2 g/dL (ref 3.5–5.0)
Alkaline Phosphatase: 49 U/L (ref 38–126)
Anion gap: 9 (ref 5–15)
BUN: 11 mg/dL (ref 6–20)
CO2: 25 mmol/L (ref 22–32)
Calcium: 8.7 mg/dL — ABNORMAL LOW (ref 8.9–10.3)
Chloride: 101 mmol/L (ref 98–111)
Creatinine, Ser: 0.6 mg/dL (ref 0.44–1.00)
GFR, Estimated: >60 mL/min (ref >60)
Glucose, Bld: 100 mg/dL — ABNORMAL HIGH (ref 70–99)
Potassium: 3.5 mmol/L (ref 3.5–5.1)
Sodium: 135 mmol/L (ref 135–145)
Total Bilirubin: 0.8 mg/dL (ref 0.3–1.2)
Total Protein: 7.6 g/dL (ref 6.5–8.1)

## 2021-02-28 LAB — RESP PANEL BY RT-PCR (FLU A&B, COVID) ARPGX2
Influenza A by PCR: NEGATIVE
Influenza B by PCR: NEGATIVE
SARS Coronavirus 2 by RT PCR: NEGATIVE

## 2021-02-28 LAB — POC URINE PREG, ED: Preg Test, Ur: NEGATIVE

## 2021-02-28 LAB — PREGNANCY, URINE: Preg Test, Ur: NEGATIVE

## 2021-02-28 LAB — LIPASE, BLOOD: Lipase: 26 U/L (ref 11–51)

## 2021-02-28 MED ORDER — ACETAMINOPHEN 325 MG PO TABS: 650.0000 mg | ORAL_TABLET | Freq: Four times a day (QID) | ORAL | Status: DC | PRN

## 2021-02-28 MED ORDER — DIPHENHYDRAMINE HCL 50 MG/ML IJ SOLN: 12.5000 mg | Freq: Four times a day (QID) | INTRAMUSCULAR | Status: DC | PRN

## 2021-02-28 MED ORDER — ONDANSETRON HCL 4 MG/2ML IJ SOLN
4.0000 mg | Freq: Once | INTRAMUSCULAR | Status: AC
  Administered 2021-02-28: 4 mg via INTRAVENOUS
  Filled 2021-02-28: qty 2

## 2021-02-28 MED ORDER — MORPHINE SULFATE (PF) 4 MG/ML IV SOLN
4.0000 mg | Freq: Once | INTRAVENOUS | Status: AC
Start: 2021-02-28 — End: 2021-02-28
  Administered 2021-02-28: 4 mg via INTRAVENOUS
  Filled 2021-02-28: qty 1

## 2021-02-28 MED ORDER — ONDANSETRON 4 MG PO TBDP: 4.0000 mg | ORAL_TABLET | Freq: Four times a day (QID) | ORAL | Status: DC | PRN

## 2021-02-28 MED ORDER — CIPROFLOXACIN IN D5W 400 MG/200ML IV SOLN
400.0000 mg | Freq: Once | INTRAVENOUS | Status: AC
  Administered 2021-02-28: 400 mg via INTRAVENOUS
  Filled 2021-02-28: qty 200

## 2021-02-28 MED ORDER — KETOROLAC TROMETHAMINE 30 MG/ML IJ SOLN
30.0000 mg | Freq: Four times a day (QID) | INTRAMUSCULAR | Status: DC | PRN
  Administered 2021-03-01: 30 mg via INTRAVENOUS
  Filled 2021-02-28: qty 1

## 2021-02-28 MED ORDER — SODIUM CHLORIDE 0.9 % IV SOLN: Freq: Once | INTRAVENOUS | Status: AC

## 2021-02-28 MED ORDER — LACTATED RINGERS IV SOLN: INTRAVENOUS | Status: DC

## 2021-02-28 MED ORDER — OXYCODONE HCL 5 MG PO TABS: 5.0000 mg | ORAL_TABLET | ORAL | Status: DC | PRN

## 2021-02-28 MED ORDER — PROMETHAZINE HCL 25 MG/ML IJ SOLN
INTRAMUSCULAR | Status: AC
  Filled 2021-02-28: qty 1

## 2021-02-28 MED ORDER — PANTOPRAZOLE SODIUM 40 MG IV SOLR
40.0000 mg | Freq: Every day | INTRAVENOUS | Status: DC
  Administered 2021-02-28: 40 mg via INTRAVENOUS
  Filled 2021-02-28: qty 40

## 2021-02-28 MED ORDER — METRONIDAZOLE 500 MG/100ML IV SOLN
500.0000 mg | Freq: Once | INTRAVENOUS | Status: AC
  Administered 2021-02-28: 500 mg via INTRAVENOUS
  Filled 2021-02-28: qty 100

## 2021-02-28 MED ORDER — MORPHINE SULFATE (PF) 4 MG/ML IV SOLN
4.0000 mg | Freq: Once | INTRAVENOUS | Status: AC
  Administered 2021-02-28: 4 mg via INTRAVENOUS
  Filled 2021-02-28: qty 1

## 2021-02-28 MED ORDER — DIPHENHYDRAMINE HCL 12.5 MG/5ML PO ELIX: 12.5000 mg | ORAL_SOLUTION | Freq: Four times a day (QID) | ORAL | Status: DC | PRN

## 2021-02-28 MED ORDER — SODIUM CHLORIDE 0.9 % IV BOLUS
1000.0000 mL | Freq: Once | INTRAVENOUS | Status: AC
  Administered 2021-02-28: 1000 mL via INTRAVENOUS

## 2021-02-28 MED ORDER — IOHEXOL 300 MG/ML  SOLN
100.0000 mL | Freq: Once | INTRAMUSCULAR | Status: AC | PRN
  Administered 2021-02-28: 100 mL via INTRAVENOUS

## 2021-02-28 MED ORDER — SIMETHICONE 80 MG PO CHEW: 40.0000 mg | CHEWABLE_TABLET | Freq: Four times a day (QID) | ORAL | Status: DC | PRN

## 2021-02-28 MED ORDER — ACETAMINOPHEN 650 MG RE SUPP: 650.0000 mg | Freq: Four times a day (QID) | RECTAL | Status: DC | PRN

## 2021-02-28 MED ORDER — CIPROFLOXACIN IN D5W 400 MG/200ML IV SOLN
400.0000 mg | Freq: Two times a day (BID) | INTRAVENOUS | Status: DC
  Administered 2021-03-01: 400 mg via INTRAVENOUS
  Filled 2021-02-28: qty 200

## 2021-02-28 MED ORDER — DOCUSATE SODIUM 100 MG PO CAPS
100.0000 mg | ORAL_CAPSULE | Freq: Two times a day (BID) | ORAL | Status: DC
  Administered 2021-02-28: 100 mg via ORAL
  Filled 2021-02-28: qty 1

## 2021-02-28 MED ORDER — SODIUM CHLORIDE 0.9 % IV SOLN
12.5000 mg | Freq: Four times a day (QID) | INTRAVENOUS | Status: DC | PRN
  Administered 2021-02-28: 12.5 mg via INTRAVENOUS
  Filled 2021-02-28: qty 0.5

## 2021-02-28 MED ORDER — MORPHINE SULFATE (PF) 2 MG/ML IV SOLN
2.0000 mg | INTRAVENOUS | Status: DC | PRN
  Administered 2021-03-01: 2 mg via INTRAVENOUS
  Filled 2021-02-28: qty 1

## 2021-02-28 MED ORDER — METRONIDAZOLE 500 MG/100ML IV SOLN
500.0000 mg | Freq: Three times a day (TID) | INTRAVENOUS | Status: DC
  Administered 2021-03-01: 500 mg via INTRAVENOUS
  Filled 2021-02-28: qty 100

## 2021-02-28 MED ORDER — ONDANSETRON HCL 4 MG/2ML IJ SOLN
4.0000 mg | Freq: Four times a day (QID) | INTRAMUSCULAR | Status: DC | PRN
  Administered 2021-03-01: 4 mg via INTRAVENOUS
  Filled 2021-02-28: qty 2

## 2021-02-28 NOTE — ED Triage Notes (Signed)
Right lower quadrant pain onset this am

## 2021-02-28 NOTE — ED Notes (Signed)
Pt ambulated to the bathroom.  

## 2021-02-28 NOTE — ED Provider Notes (Signed)
[facility] EMERGENCY DEPARTMENT Provider Note   CSN: [id number] Arrival date & time: 02/28/21  1617     History Chief Complaint  Patient presents with   Abdominal Pain    Suzanne Horton is a 30 y.o. female.  Pt presents to the ED today with RLQ abd pain.  Pt said it started this am around 0500.  It woke her from sleep.  She has some nausea as well.  No f/c.  She has never had anything like this in the past.  LMP 2 weeks ago.      Past Medical History:  Diagnosis Date   ADHD (attention deficit hyperactivity disorder)    Anxiety    Endometriosis     Patient Active Problem List   Diagnosis Date Noted   KNEE PAIN 03/15/2011   ANKLE PAIN 03/15/2011   FOOT PAIN 03/15/2011    History reviewed. No pertinent surgical history.   OB History   No obstetric history on file.     Family History  Problem Relation Age of Onset   Thyroid disease Mother    Hypertension Father     Social History   Tobacco Use   Smoking status: Former    Packs/day: 0.25    Years: 2.00    Pack years: 0.50    Types: Cigarettes  Substance Use Topics   Alcohol use: Not Currently    Comment: "couple of shots every night"   Drug use: Not Currently    Frequency: 3.0 times per week    Types: Marijuana    Home Medications Prior to Admission medications   Medication Sig Start Date End Date Taking? Authorizing Provider  sertraline (ZOLOFT) 100 MG tablet Take 100 mg by mouth every morning. Total of 150mg  taken daily in the morning   Yes [provider]  sertraline (ZOLOFT) 50 MG tablet Take 50 mg by mouth every morning. 12/10/20  Yes [provider]    Allergies    Penicillins, Hydromorphone, and Ativan [lorazepam]  Review of Systems   Review of Systems  Gastrointestinal:  Positive for abdominal pain and nausea.  All other systems reviewed and are negative.  Physical Exam Updated Vital Signs BP 116/70   Pulse 78   Temp 98.1 F (36.7 C) (Oral)   Resp 16   Ht 5\' 1"   (1.549 m)   Wt 63.5 kg   SpO2 99%   BMI 26.45 kg/m   Physical Exam Vitals and nursing note reviewed.  Constitutional:      Appearance: She is well-developed.  HENT:     Head: Normocephalic and atraumatic.     Mouth/Throat:     Mouth: Mucous membranes are moist.  Eyes:     Extraocular Movements: Extraocular movements intact.     Pupils: Pupils are equal, round, and reactive to light.  Cardiovascular:     Rate and Rhythm: Regular rhythm. Tachycardia present.  Pulmonary:     Effort: Pulmonary effort is normal.     Breath sounds: Normal breath sounds.  Abdominal:     General: Abdomen is flat. Bowel sounds are normal.     Palpations: Abdomen is soft.     Tenderness: There is abdominal tenderness in the right lower quadrant.  Skin:    General: Skin is warm.     Capillary Refill: Capillary refill takes less than 2 seconds.  Neurological:     General: No focal deficit present.     Mental Status: She is alert and oriented to person, place, and time.  Psychiatric:        Mood and Affect: Mood normal.        Behavior: Behavior normal.    ED Results / Procedures / Treatments   Labs (all labs ordered are listed, but only abnormal results are displayed) Labs Reviewed  CBC WITH DIFFERENTIAL/PLATELET - Abnormal; Notable for the following components:      Result Value   WBC 16.2 (*)    Neutro Abs 14.3 (*)    All other components within normal limits  COMPREHENSIVE METABOLIC PANEL - Abnormal; Notable for the following components:   Glucose, Bld 100 (*)    Calcium 8.7 (*)    All other components within normal limits  URINALYSIS, ROUTINE W REFLEX MICROSCOPIC - Abnormal; Notable for the following components:   APPearance HAZY (*)    Ketones, ur 5 (*)    Leukocytes,Ua MODERATE (*)    All other components within normal limits  RESP PANEL BY RT-PCR (FLU A&B, COVID) ARPGX2  LIPASE, BLOOD  PREGNANCY, URINE  POC URINE PREG, ED    EKG None  Radiology CT Abdomen Pelvis W  Contrast  Result Date: 02/28/2021 CLINICAL DATA:  Right lower quadrant pain. EXAM: CT ABDOMEN AND PELVIS WITH CONTRAST TECHNIQUE: Multidetector CT imaging of the abdomen and pelvis was performed using the standard protocol following bolus administration of intravenous contrast. CONTRAST:  OMNIPAQUE IOHEXOL 300 MG/ML  SOLN COMPARISON:  CT abdomen pelvis 12/10/2014. FINDINGS: Lower chest: No acute abnormality. Hepatobiliary: No focal liver abnormality. No gallstones, gallbladder wall thickening, or pericholecystic fluid. No biliary dilatation. Pancreas: No focal lesion. Normal pancreatic contour. No surrounding inflammatory changes. No main pancreatic ductal dilatation. Spleen: Normal in size without focal abnormality. Adrenals/Urinary Tract: No adrenal nodule bilaterally. Bilateral kidneys enhance symmetrically. Punctate calcified stone within the right kidney (2:30). No hydronephrosis. No hydroureter. The urinary bladder is unremarkable. Stomach/Bowel: Stomach is within normal limits. No evidence of bowel wall thickening or dilatation. The appendix is enlarged in caliber measuring up to 1 cm. Appendiceal wall thickening and enhancement. No appendiceal wall discontinuity. Associated periappendiceal fat stranding. No appendicolith identified. The appendix is located anteroinferior to the cecum (2:57). Vascular/Lymphatic: No abdominal aorta or iliac aneurysm. No abdominal, pelvic, or inguinal lymphadenopathy. Reproductive: Uterus and bilateral adnexa are unremarkable. Other: No intraperitoneal free fluid. No intraperitoneal free gas. No organized fluid collection. Musculoskeletal: No abdominal wall hernia or abnormality. No suspicious lytic or blastic osseous lesions. No acute displaced fracture. IMPRESSION: Non-perforated acute appendicitis.  No appendicolith. Electronically Signed   By: Tish Frederickson M.D.   On: 02/28/2021 19:33    Procedures Procedures   Medications Ordered in ED Medications   ciprofloxacin (CIPRO) IVPB 400 mg (400 mg Intravenous New Bag/Given 02/28/21 2006)    And  metroNIDAZOLE (FLAGYL) IVPB 500 mg (500 mg Intravenous New Bag/Given 02/28/21 2007)  ondansetron (ZOFRAN) injection 4 mg (4 mg Intravenous Given 02/28/21 1709)  sodium chloride 0.9 % bolus 1,000 mL (0 mLs Intravenous Stopped 02/28/21 1808)  morphine 4 MG/ML injection 4 mg (4 mg Intravenous Given 02/28/21 1709)  iohexol (OMNIPAQUE) 300 MG/ML solution 100 mL (100 mLs Intravenous Contrast Given 02/28/21 1901)  morphine 4 MG/ML injection 4 mg (4 mg Intravenous Given 02/28/21 2008)  0.9 %  sodium chloride infusion ( Intravenous New Bag/Given 02/28/21 2011)    ED Course  I have reviewed the triage vital signs and the nursing notes.  Pertinent labs & imaging results that were available during my care of the patient were reviewed by me and  considered in my medical decision making (see chart for details).    MDM Rules/Calculators/A&P                           Pt's CT scan shows acute appendicitis.  She is allergic to pcn so is given cipro/flagyl.  She is d/w Dr. Henreitta Leber (surgery) who will admit her over night and take her to the OR in the morning.  Final Clinical Impression(s) / ED Diagnoses Final diagnoses:  Acute appendicitis with localized peritonitis, without perforation, abscess, or gangrene    Rx / DC Orders ED Discharge Orders     None        Jacalyn Lefevre, MD 02/28/21 2012

## 2021-02-28 NOTE — Progress Notes (Signed)
[facility]  Acute appendicitis on CT. IV antibiotics now. Can have clears until midnight. Case tomorrow. Will talk to her about it in the AM.  COVID pending.  Algis Greenhouse, MD [facility] [address] [place], [place] [address] [phone] (office)

## 2021-03-01 ENCOUNTER — Encounter (HOSPITAL_COMMUNITY)
Admission: EM | Disposition: A | Discharge status: Home / Self Care | Payer: Self-pay | Source: Home / Self Care | Attending: Emergency Medicine

## 2021-03-01 ENCOUNTER — Encounter (HOSPITAL_COMMUNITY): Payer: Self-pay | Admitting: General Surgery

## 2021-03-01 ENCOUNTER — Observation Stay (HOSPITAL_COMMUNITY): Payer: Medicaid Other | Admitting: Anesthesiology

## 2021-03-01 ENCOUNTER — Other Ambulatory Visit: Payer: Self-pay

## 2021-03-01 DIAGNOSIS — Z20822 Contact with and (suspected) exposure to covid-19: Secondary | ICD-10-CM | POA: Diagnosis not present

## 2021-03-01 DIAGNOSIS — Z87891 Personal history of nicotine dependence: Secondary | ICD-10-CM | POA: Diagnosis not present

## 2021-03-01 DIAGNOSIS — K353 Acute appendicitis with localized peritonitis, without perforation or gangrene: Secondary | ICD-10-CM

## 2021-03-01 HISTORY — PX: LAPAROSCOPIC APPENDECTOMY: SHX408

## 2021-03-01 LAB — HIV ANTIBODY (ROUTINE TESTING W REFLEX): HIV Screen 4th Generation wRfx: NONREACTIVE

## 2021-03-01 SURGERY — APPENDECTOMY, LAPAROSCOPIC
Anesthesia: General

## 2021-03-01 MED ORDER — MEPERIDINE HCL 50 MG/ML IJ SOLN: 6.2500 mg | INTRAMUSCULAR | Status: DC | PRN

## 2021-03-01 MED ORDER — KETOROLAC TROMETHAMINE 30 MG/ML IJ SOLN
INTRAMUSCULAR | Status: DC | PRN
Start: 2021-03-01 — End: 2021-03-01
  Administered 2021-03-01: 30 mg via INTRAVENOUS

## 2021-03-01 MED ORDER — PROPOFOL 10 MG/ML IV BOLUS
INTRAVENOUS | Status: AC
  Filled 2021-03-01: qty 20

## 2021-03-01 MED ORDER — CHLORHEXIDINE GLUCONATE CLOTH 2 % EX PADS: 6.0000 | MEDICATED_PAD | Freq: Once | CUTANEOUS | Status: DC

## 2021-03-01 MED ORDER — MIDAZOLAM HCL 2 MG/2ML IJ SOLN
2.0000 mg | Freq: Once | INTRAMUSCULAR | Status: AC
  Administered 2021-03-01: 2 mg via INTRAVENOUS
  Filled 2021-03-01: qty 2

## 2021-03-01 MED ORDER — ROCURONIUM BROMIDE 10 MG/ML (PF) SYRINGE
PREFILLED_SYRINGE | INTRAVENOUS | Status: AC
  Filled 2021-03-01: qty 10

## 2021-03-01 MED ORDER — SCOPOLAMINE 1 MG/3DAYS TD PT72
1.0000 | MEDICATED_PATCH | Freq: Once | TRANSDERMAL | Status: DC
  Administered 2021-03-01: 1.5 mg via TRANSDERMAL
  Filled 2021-03-01: qty 1

## 2021-03-01 MED ORDER — FENTANYL CITRATE (PF) 250 MCG/5ML IJ SOLN
INTRAMUSCULAR | Status: AC
  Filled 2021-03-01: qty 5

## 2021-03-01 MED ORDER — KETOROLAC TROMETHAMINE 30 MG/ML IJ SOLN
INTRAMUSCULAR | Status: AC
  Filled 2021-03-01: qty 1

## 2021-03-01 MED ORDER — ROCURONIUM BROMIDE 10 MG/ML (PF) SYRINGE
PREFILLED_SYRINGE | INTRAVENOUS | Status: DC | PRN
  Administered 2021-03-01: 50 mg via INTRAVENOUS

## 2021-03-01 MED ORDER — BUPIVACAINE LIPOSOME 1.3 % IJ SUSP
INTRAMUSCULAR | Status: AC
  Filled 2021-03-01: qty 20

## 2021-03-01 MED ORDER — DEXMEDETOMIDINE (PRECEDEX) IN NS 20 MCG/5ML (4 MCG/ML) IV SYRINGE
PREFILLED_SYRINGE | INTRAVENOUS | Status: DC | PRN
  Administered 2021-03-01: 20 ug via INTRAVENOUS

## 2021-03-01 MED ORDER — CHLORHEXIDINE GLUCONATE 0.12 % MT SOLN
15.0000 mL | Freq: Once | OROMUCOSAL | Status: AC
  Administered 2021-03-01: 15 mL via OROMUCOSAL
  Filled 2021-03-01: qty 15

## 2021-03-01 MED ORDER — ONDANSETRON HCL 4 MG/2ML IJ SOLN
INTRAMUSCULAR | Status: AC
  Filled 2021-03-01: qty 2

## 2021-03-01 MED ORDER — ONDANSETRON 4 MG PO TBDP: 4.0000 mg | ORAL_TABLET | Freq: Four times a day (QID) | ORAL | 0 refills | Status: AC | PRN

## 2021-03-01 MED ORDER — OXYCODONE HCL 5 MG PO TABS: 5.0000 mg | ORAL_TABLET | ORAL | 0 refills | Status: DC | PRN

## 2021-03-01 MED ORDER — LIDOCAINE 2% (20 MG/ML) 5 ML SYRINGE
INTRAMUSCULAR | Status: DC | PRN
  Administered 2021-03-01: 100 mg via INTRAVENOUS

## 2021-03-01 MED ORDER — MIDAZOLAM HCL 2 MG/2ML IJ SOLN
INTRAMUSCULAR | Status: AC
  Filled 2021-03-01: qty 2

## 2021-03-01 MED ORDER — DEXAMETHASONE SODIUM PHOSPHATE 10 MG/ML IJ SOLN
INTRAMUSCULAR | Status: DC | PRN
  Administered 2021-03-01: 4 mg via INTRAVENOUS

## 2021-03-01 MED ORDER — BUPIVACAINE LIPOSOME 1.3 % IJ SUSP
INTRAMUSCULAR | Status: DC | PRN
  Administered 2021-03-01: 20 mL

## 2021-03-01 MED ORDER — PROPOFOL 10 MG/ML IV BOLUS
INTRAVENOUS | Status: DC | PRN
  Administered 2021-03-01: 200 mg via INTRAVENOUS

## 2021-03-01 MED ORDER — SODIUM CHLORIDE 0.9 % IR SOLN
Status: DC | PRN
  Administered 2021-03-01: 1000 mL

## 2021-03-01 MED ORDER — FENTANYL CITRATE (PF) 100 MCG/2ML IJ SOLN: 25.0000 ug | INTRAMUSCULAR | Status: DC | PRN

## 2021-03-01 MED ORDER — FENTANYL CITRATE (PF) 100 MCG/2ML IJ SOLN
INTRAMUSCULAR | Status: DC | PRN
  Administered 2021-03-01: 50 ug via INTRAVENOUS
  Administered 2021-03-01: 100 ug via INTRAVENOUS

## 2021-03-01 MED ORDER — PROMETHAZINE HCL 25 MG/ML IJ SOLN: 6.2500 mg | INTRAMUSCULAR | Status: DC | PRN

## 2021-03-01 MED ORDER — DEXMEDETOMIDINE (PRECEDEX) IN NS 20 MCG/5ML (4 MCG/ML) IV SYRINGE
PREFILLED_SYRINGE | INTRAVENOUS | Status: AC
  Filled 2021-03-01: qty 5

## 2021-03-01 MED ORDER — SUGAMMADEX SODIUM 200 MG/2ML IV SOLN
INTRAVENOUS | Status: DC | PRN
  Administered 2021-03-01: 200 mg via INTRAVENOUS

## 2021-03-01 MED ORDER — LIDOCAINE HCL (PF) 2 % IJ SOLN
INTRAMUSCULAR | Status: AC
  Filled 2021-03-01: qty 5

## 2021-03-01 MED ORDER — ORAL CARE MOUTH RINSE: 15.0000 mL | Freq: Once | OROMUCOSAL | Status: AC

## 2021-03-01 MED ORDER — DEXAMETHASONE SODIUM PHOSPHATE 10 MG/ML IJ SOLN
INTRAMUSCULAR | Status: AC
  Filled 2021-03-01: qty 1

## 2021-03-01 MED ORDER — LACTATED RINGERS IV SOLN: INTRAVENOUS | Status: DC

## 2021-03-01 MED ORDER — ONDANSETRON HCL 4 MG/2ML IJ SOLN
INTRAMUSCULAR | Status: DC | PRN
  Administered 2021-03-01: 4 mg via INTRAVENOUS

## 2021-03-01 SURGICAL SUPPLY — 40 items
BAG RETRIEVAL 10 (BASKET) ×1
BLADE SURG 15 STRL LF DISP TIS (BLADE) ×1 IMPLANT
BLADE SURG 15 STRL SS (BLADE) ×1
CHLORAPREP W/TINT 26 (MISCELLANEOUS) ×2 IMPLANT
CLOTH BEACON ORANGE TIMEOUT ST (SAFETY) ×2 IMPLANT
COVER LIGHT HANDLE STERIS (MISCELLANEOUS) ×4 IMPLANT
CUTTER FLEX LINEAR 45M (STAPLE) ×2 IMPLANT
DERMABOND ADVANCED (GAUZE/BANDAGES/DRESSINGS) ×1
DERMABOND ADVANCED .7 DNX12 (GAUZE/BANDAGES/DRESSINGS) ×1 IMPLANT
ELECT REM PT RETURN 9FT ADLT (ELECTROSURGICAL) ×2
ELECTRODE REM PT RTRN 9FT ADLT (ELECTROSURGICAL) ×1 IMPLANT
GLOVE SURG ENC MOIS LTX SZ6.5 (GLOVE) ×2 IMPLANT
GLOVE SURG UNDER POLY LF SZ6.5 (GLOVE) ×2 IMPLANT
GLOVE SURG UNDER POLY LF SZ7 (GLOVE) ×6 IMPLANT
GOWN STRL REUS W/TWL LRG LVL3 (GOWN DISPOSABLE) ×4 IMPLANT
INST SET LAPROSCOPIC AP (KITS) ×2 IMPLANT
KIT TURNOVER KIT A (KITS) ×2 IMPLANT
MANIFOLD NEPTUNE II (INSTRUMENTS) ×2 IMPLANT
NEEDLE HYPO 18GX1.5 BLUNT FILL (NEEDLE) ×2 IMPLANT
NEEDLE HYPO 21X1.5 SAFETY (NEEDLE) ×2 IMPLANT
NEEDLE INSUFFLATION 14GA 120MM (NEEDLE) ×2 IMPLANT
NS IRRIG 1000ML POUR BTL (IV SOLUTION) ×2 IMPLANT
PACK LAP CHOLE LZT030E (CUSTOM PROCEDURE TRAY) ×2 IMPLANT
PAD ARMBOARD 7.5X6 YLW CONV (MISCELLANEOUS) ×2 IMPLANT
RELOAD STAPLE TA45 3.5 REG BLU (ENDOMECHANICALS) ×2 IMPLANT
SET BASIN LINEN APH (SET/KITS/TRAYS/PACK) ×2 IMPLANT
SET TUBE SMOKE EVAC HIGH FLOW (TUBING) ×2 IMPLANT
SHEARS HARMONIC ACE PLUS 36CM (ENDOMECHANICALS) ×2 IMPLANT
SUT MNCRL AB 4-0 PS2 18 (SUTURE) ×4 IMPLANT
SUT VICRYL 0 UR6 27IN ABS (SUTURE) ×2 IMPLANT
SYR 20ML LL LF (SYRINGE) ×4 IMPLANT
SYS BAG RETRIEVAL 10MM (BASKET) ×1
SYSTEM BAG RETRIEVAL 10MM (BASKET) ×1 IMPLANT
TRAY FOLEY W/BAG SLVR 16FR (SET/KITS/TRAYS/PACK) ×1
TRAY FOLEY W/BAG SLVR 16FR ST (SET/KITS/TRAYS/PACK) ×1 IMPLANT
TROCAR ENDO BLADELESS 11MM (ENDOMECHANICALS) ×2 IMPLANT
TROCAR ENDO BLADELESS 12MM (ENDOMECHANICALS) ×2 IMPLANT
TROCAR XCEL NON-BLD 5MMX100MML (ENDOMECHANICALS) ×2 IMPLANT
TUBE CONNECTING 12X1/4 (SUCTIONS) ×2 IMPLANT
WARMER LAPAROSCOPE (MISCELLANEOUS) ×2 IMPLANT

## 2021-03-01 NOTE — ED Notes (Signed)
Pt signed consent and updated on situation

## 2021-03-01 NOTE — Op Note (Signed)
[facility]  Date of Surgery: 02/28/2021 - 03/01/2021  Admit Date: 02/28/2021   Performing Service: General  Surgeon(s) and Role:    Lucretia Roers, MD - Primary   Pre-operative Diagnosis: Acute Appendicitis  Post-operative Diagnosis: Acute Appendicitis  Procedure Performed: Laparoscopic Appendectomy   Surgeon: Leatrice Jewels. Henreitta Leber, MD   Assistant: No qualified resident was available.   Anesthesia: General   Findings:  The appendix was found to be inflamed. There were not signs of necrosis. There was not perforation. There was not abscess formation.   Estimated Blood Loss: Minimal   Specimens:  ID Type Source Tests Collected by Time Destination  1 : Appendix Tissue PATH Appendix SURGICAL PATHOLOGY Lucretia Roers, MD 03/01/2021 0911      Complications: None; patient tolerated the procedure well.   Disposition: PACU - hemodynamically stable.   Condition: stable   Indications: The patient presented with a 1 day history of right-sided abdominal pain. A CT revealed findings consistent with acute appendicitis.   Procedure Details  Prior to the procedure, the risks, benefits, complications, treatment options, and expected outcomes were discussed with the patient and/or family, including but not limited to the risk of bleeding, infection, finding of a normal appendix, and the need for conversion to an open procedure. There was concurrence with the proposed plan and informed consent was obtained. The patient was taken to the operating room, identified as Suzanne Horton and the procedure verified as Laproscopic Appendectomy.    The patient was placed in the supine position and general anesthesia was induced, along with placement of orogastric tube, SCD's, and a Foley catheter. The abdomen was prepped and draped in a sterile fashion. The abdomen was entered with Veress technique in the infraumbilical incision. Intraperitoneal placement was confirmed with saline  drop, low entry pressures, and easy insufflation. A 11 mm optiview trocar was placed under direct visualization with a 0 degree scope. The 10 mm 0 degree scope was placed in the abdomen and no evidence of injury was identified. A 12 mm port was placed in the left lower quadrant of the abdomen after skin incision with trocar placement under direct vision. A careful evaluation of the entire abdomen was carried out. An additional 5 mm port was placed in the suprapubic area under direct vision.  The patient was placed in Trendelenburg and left lateral decubitus position. The small intestines were retracted in the cephalad and left lateral direction away from the pelvis and right lower quadrant. The patient was found to have an dilated and inflamed appendix. There was not evidence of perforation.   The appendix was carefully dissected. The mesoappendix was taken with a harmonic scalpel and the appendix was divided at its base using a standard endo-GIA stapler. Minimal appendiceal stump was left in place. The appendix was placed within an Endocatch specimen bag.  A staple line bleed was controlled with cautery. There was no evidence of bleeding, leakage, or complication after division of the appendix.  Any remaining blood or pus was suctioned out from the abdomen, hemostasis was confirmed. The endocatch bag was removed via the 12 mm port, then the abdomen desufflated. The appendix was passed off the field as a specimen.   The the 12 mm and 10 mm port sites were closed with a 0 Vicryl suture. The trocar site skin wounds were closed using subcuticular 4-0 Monocryl suture and dermabond. The patient was then awakened from general anesthesia, extubated, and taken to PACU for recovery.   Instrument,  sponge, and needle counts were correct at the conclusion of the case.   Algis Greenhouse, MD [facility] [address] [place] [place], [place] [address] [phone]([id number])

## 2021-03-01 NOTE — Discharge Instructions (Signed)
Discharge Laparoscopic Surgery Instructions:  Common Complaints: Right shoulder pain is common after laparoscopic surgery. This is secondary to the gas used in the surgery being trapped under the diaphragm.  Walk to help your body absorb the gas. This will improve in a few days. Pain at the port sites are common, especially the larger port sites. This will improve with time.  Some nausea is common and poor appetite. The main goal is to stay hydrated the first few days after surgery.   Diet/ Activity: Diet as tolerated. You may not have an appetite, but it is important to stay hydrated. Drink 64 ounces of water a day. Your appetite will return with time.  Shower per your regular routine daily.  Do not take hot showers. Take warm showers that are less than 10 minutes. Rest and listen to your body, but do not remain in bed all day.  Walk everyday for at least 15-20 minutes. Deep cough and move around every 1-2 hours in the first few days after surgery.  Do not lift > 10 lbs, perform excessive bending, pushing, pulling, squatting for 1 week after surgery.  Do not pick at the dermabond glue on your incision sites.  This glue film will remain in place for 1-2 weeks and will start to peel off.  Do not place lotions or balms on your incision unless instructed to specifically by Dr. Henreitta Leber.   Pain Expectations and Narcotics: -After surgery you will have pain associated with your incisions and this is normal. The pain is muscular and nerve pain, and will get better with time. -You are encouraged and expected to take non narcotic medications like tylenol and ibuprofen (when able) to treat pain as multiple modalities can aid with pain treatment. -Narcotics are only used when pain is severe or there is breakthrough pain. -You are not expected to have a pain score of 0 after surgery, as we cannot prevent pain. A pain score of 3-4 that allows you to be functional, move, walk, and tolerate some activity is the  goal. The pain will continue to improve over the days after surgery and is dependent on your surgery. -Due to [place] law, we are only able to give a certain amount of pain medication to treat post operative pain, and we only give additional narcotics on a patient by patient basis.  -For most laparoscopic surgery, studies have shown that the majority of patients only need 10-15 narcotic pills, and for open surgeries most patients only need 15-20.   -Having appropriate expectations of pain and knowledge of pain management with non narcotics is important as we do not want anyone to become addicted to narcotic pain medication.  -Using ice packs in the first 48 hours and heating pads after 48 hours, wearing an abdominal binder (when recommended), and using over the counter medications are all ways to help with pain management.   -Simple acts like meditation and mindfulness practices after surgery can also help with pain control and research has proven the benefit of these practices.  Medication: Take tylenol and ibuprofen as needed for pain control, alternating every 4-6 hours.  Example:  Tylenol 1000mg  @ 6am, 12noon, 6pm, (Do not exceed 4000mg  of tylenol a day). Ibuprofen 800mg  @ 9am, 3pm, 9pm, 3am (Do not exceed 3600mg  of ibuprofen a day).  Take Roxicodone for breakthrough pain every 4 hours.  Take Colace for constipation related to narcotic pain medication. If you do not have a bowel movement in 2 days, take Miralax  over the counter.  Drink plenty of water to also prevent constipation.   Contact Information: If you have questions or concerns, please call our office, [phone], Monday- Thursday 8AM-5PM and Friday 8AM-12Noon.  If it is after hours or on the weekend, please call [facility] Main Number, [phone], [phone], and ask to speak to the surgeon on call for Dr. Kaelon Weekes at [place].   

## 2021-03-01 NOTE — Progress Notes (Signed)
[facility]  Updated boyfriend about surgery. Ok to go home after in PACU. He lives in [place] and will have to come get her.  Algis Greenhouse, MD [facility] [address] [place], [place] [address] [phone] (office)

## 2021-03-01 NOTE — Anesthesia Preprocedure Evaluation (Signed)
Anesthesia Evaluation  Patient identified by MRN, date of birth, ID band Patient awake    Reviewed: Allergy & Precautions, NPO status , Patient's Chart, lab work & pertinent test results  Airway        Dental  (+) Dental Advisory Given   Pulmonary former smoker,    Pulmonary exam normal breath sounds clear to auscultation       Cardiovascular Exercise Tolerance: Good Normal cardiovascular exam Rhythm:Regular Rate:Normal     Neuro/Psych PSYCHIATRIC DISORDERS Anxiety    GI/Hepatic negative GI ROS, Neg liver ROS,   Endo/Other  negative endocrine ROS  Renal/GU negative Renal ROS     Musculoskeletal negative musculoskeletal ROS (+)   Abdominal   Peds  (+) ADHD Hematology negative hematology ROS (+)   Anesthesia Other Findings   Reproductive/Obstetrics negative OB ROS                             Anesthesia Physical Anesthesia Plan  ASA: 2  Anesthesia Plan: General   Post-op Pain Management:    Induction: Intravenous  PONV Risk Score and Plan: 4 or greater and Ondansetron, Dexamethasone, Midazolam and Scopolamine patch - Pre-op  Airway Management Planned: Oral ETT  Additional Equipment:   Intra-op Plan:   Post-operative Plan: Extubation in OR  Informed Consent: I have reviewed the patients History and Physical, chart, labs and discussed the procedure including the risks, benefits and alternatives for the proposed anesthesia with the patient or authorized representative who has indicated his/her understanding and acceptance.     Dental advisory given  Plan Discussed with: CRNA and Surgeon  Anesthesia Plan Comments:         Anesthesia Quick Evaluation

## 2021-03-01 NOTE — H&P (Signed)
[facility] History and Physical  Reason for Referral: Acute appendicitis  Referring Physician:  Dr. Particia Nearing   Chief Complaint   Abdominal Pain     Suzanne Horton is a 30 y.o. female.  HPI: Suzanne Horton is a 30 yo with anxiety who comes in with acute onset of abdominal pain in the abdomen that is sharp in nature and started 5AM yesterday. She has had associated nausea. It is mostly in the RLQ now. She says the pain medication helps some but the pain is constant. She has not vomited but has a fear of vomiting.  She is very nervous about anesthesia and vomiting.   Past Medical History:  Diagnosis Date   ADHD (attention deficit hyperactivity disorder)    Anxiety    Endometriosis     History reviewed. No pertinent surgical history.  Family History  Problem Relation Age of Onset   Thyroid disease Mother    Hypertension Father     Social History   Tobacco Use   Smoking status: Former    Packs/day: 0.25    Years: 2.00    Pack years: 0.50    Types: Cigarettes  Substance Use Topics   Alcohol use: Not Currently    Comment: "couple of shots every night"   Drug use: Not Currently    Frequency: 3.0 times per week    Types: Marijuana    Medications: I have reviewed the patient's current medications. Prior to Admission: (Not in a hospital admission)  Scheduled:  Chlorhexidine Gluconate Cloth  6 each Topical Once   docusate sodium  100 mg Oral BID   pantoprazole (PROTONIX) IV  40 mg Intravenous QHS   Continuous:  ciprofloxacin     And   metronidazole Stopped (03/01/21 0538)   lactated ringers 50 mL/hr at 03/01/21 4888   promethazine (PHENERGAN) injection (IM or IVPB) 12.5 mg (02/28/21 2141)   BVQ:[id number] **OR** acetaminophen, diphenhydrAMINE **OR** diphenhydrAMINE, ketorolac, morphine injection, ondansetron **OR** ondansetron (ZOFRAN) IV, oxyCODONE, promethazine (PHENERGAN) injection (IM or IVPB), simethicone Allergies  Allergen Reactions    Penicillins Anaphylaxis   Hydromorphone Nausea And Vomiting   Ativan [Lorazepam] Other (See Comments)    Makes her feel like she is not herself      ROS:  A comprehensive review of systems was negative except for: Gastrointestinal: positive for abdominal pain and nausea  Blood pressure 109/64, pulse 71, temperature 98.5 F (36.9 C), temperature source Oral, resp. rate 20, height 5\' 1"  (1.549 m), weight 63.5 kg, SpO2 100 %. Physical Exam Vitals reviewed.  Constitutional:      Appearance: She is well-developed.  HENT:     Head: Normocephalic.  Eyes:     Extraocular Movements: Extraocular movements intact.  Cardiovascular:     Rate and Rhythm: Normal rate and regular rhythm.  Pulmonary:     Effort: Pulmonary effort is normal.  Abdominal:     General: There is no distension.     Palpations: Abdomen is soft.     Tenderness: There is abdominal tenderness in the right lower quadrant.  Musculoskeletal:     Comments: Moves all extremities   Skin:    General: Skin is warm.  Neurological:     General: No focal deficit present.     Mental Status: She is alert and oriented to person, place, and time.  Psychiatric:        Mood and Affect: Mood normal.        Behavior: Behavior normal.    Results: Results for  orders placed or performed during the hospital encounter of 02/28/21 (from the past 48 hour(s))  CBC with Differential     Status: Abnormal   Collection Time: 02/28/21  4:48 PM  Result Value Ref Range   WBC 16.2 (H) 4.0 - 10.5 K/uL   RBC 4.19 3.87 - 5.11 MIL/uL   Hemoglobin 13.0 12.0 - 15.0 g/dL   HCT 16.139.2 09.636.0 - 04.546.0 %   MCV 93.6 80.0 - 100.0 fL   MCH 31.0 26.0 - 34.0 pg   MCHC 33.2 30.0 - 36.0 g/dL   RDW 40.913.0 81.111.5 - 91.415.5 %   Platelets 238 150 - 400 K/uL   nRBC 0.0 0.0 - 0.2 %   Neutrophils Relative % 89 %   Neutro Abs 14.3 (H) 1.7 - 7.7 K/uL   Lymphocytes Relative 8 %   Lymphs Abs 1.3 0.7 - 4.0 K/uL   Monocytes Relative 3 %   Monocytes Absolute 0.4 0.1 - 1.0 K/uL    Eosinophils Relative 0 %   Eosinophils Absolute 0.0 0.0 - 0.5 K/uL   Basophils Relative 0 %   Basophils Absolute 0.0 0.0 - 0.1 K/uL   Immature Granulocytes 0 %   Abs Immature Granulocytes 0.07 0.00 - 0.07 K/uL    Comment: Performed at [facility], [address] [place], [place] [address]  Comprehensive metabolic panel     Status: Abnormal   Collection Time: 02/28/21  4:48 PM  Result Value Ref Range   Sodium 135 135 - 145 mmol/L   Potassium 3.5 3.5 - 5.1 mmol/L   Chloride 101 98 - 111 mmol/L   CO2 25 22 - 32 mmol/L   Glucose, Bld 100 (H) 70 - 99 mg/dL    Comment: Glucose reference range applies only to samples taken after fasting for at least 8 hours.   BUN 11 6 - 20 mg/dL   Creatinine, Ser 6.210.60 0.44 - 1.00 mg/dL   Calcium 8.7 (L) 8.9 - 10.3 mg/dL   Total Protein 7.6 6.5 - 8.1 g/dL   Albumin 4.2 3.5 - 5.0 g/dL   AST 18 15 - 41 U/L   ALT 10 0 - 44 U/L   Alkaline Phosphatase 49 38 - 126 U/L   Total Bilirubin 0.8 0.3 - 1.2 mg/dL   GFR, Estimated >30>60 >86>60 mL/min    Comment: (NOTE) Calculated using the CKD-EPI Creatinine Equation (2021)    Anion gap 9 5 - 15    Comment: Performed at [facility], [address] [place], [place] [address]  Lipase, blood     Status: None   Collection Time: 02/28/21  4:48 PM  Result Value Ref Range   Lipase 26 11 - 51 U/L    Comment: Performed at [facility], [address] [place], [place] [address]  Urinalysis, Routine w reflex microscopic     Status: Abnormal   Collection Time: 02/28/21  4:48 PM  Result Value Ref Range   Color, Urine YELLOW YELLOW   APPearance HAZY (A) CLEAR   Specific Gravity, Urine 1.013 1.005 - 1.030   pH 8.0 5.0 - 8.0   Glucose, UA NEGATIVE NEGATIVE mg/dL   Hgb urine dipstick NEGATIVE NEGATIVE   Bilirubin Urine NEGATIVE NEGATIVE   Ketones, ur 5 (A) NEGATIVE mg/dL   Protein, ur NEGATIVE NEGATIVE mg/dL   Nitrite NEGATIVE NEGATIVE   Leukocytes,Ua MODERATE (A) NEGATIVE   RBC / HPF 0-5 0 - 5 RBC/hpf   WBC, UA 6-10 0 -  5 WBC/hpf   Bacteria, UA  NONE SEEN NONE SEEN   Squamous Epithelial / LPF 11-20 0 - 5   Mucus PRESENT     Comment: Performed at [facility], [address] [place], [place] [address]  Pregnancy, urine     Status: None   Collection Time: 02/28/21  4:48 PM  Result Value Ref Range   Preg Test, Ur NEGATIVE NEGATIVE    Comment:        THE SENSITIVITY OF THIS METHODOLOGY IS >20 mIU/mL. Performed at [facility], [address], [place], [place] [address]   POC urine preg, ED (not at [facility])     Status: None   Collection Time: 02/28/21  6:14 PM  Result Value Ref Range   Preg Test, Ur NEGATIVE NEGATIVE    Comment:        THE SENSITIVITY OF THIS METHODOLOGY IS >24 mIU/mL   Resp Panel by RT-PCR (Flu A&B, Covid) Nasopharyngeal Swab     Status: None   Collection Time: 02/28/21  8:25 PM   Specimen: Nasopharyngeal Swab; Nasopharyngeal(NP) swabs in vial transport medium  Result Value Ref Range   SARS Coronavirus 2 by RT PCR NEGATIVE NEGATIVE    Comment: (NOTE) SARS-CoV-2 target nucleic acids are NOT DETECTED.  The SARS-CoV-2 RNA is generally detectable in upper respiratory specimens during the acute phase of infection. The lowest concentration of SARS-CoV-2 viral copies this assay can detect is 138 copies/mL. A negative result does not preclude SARS-Cov-2 infection and should not be used as the sole basis for treatment or other patient management decisions. A negative result may occur with  improper specimen collection/handling, submission of specimen other than nasopharyngeal swab, presence of viral mutation(s) within the areas targeted by this assay, and inadequate number of viral copies(<138 copies/mL). A negative result must be combined with clinical observations, patient history, and epidemiological information. The expected result is Negative.  Fact Sheet for Patients:  BloggerCourse.com  Fact Sheet for Healthcare Providers:   SeriousBroker.it  This test is no t yet approved or cleared by the [place] FDA and  has been authorized for detection and/or diagnosis of SARS-CoV-2 by FDA under an Emergency Use Authorization (EUA). This EUA will remain  in effect (meaning this test can be used) for the duration of the COVID-19 declaration under Section 564(b)(1) of the Act, 21 U.S.C.section 360bbb-3(b)(1), unless the authorization is terminated  or revoked sooner.       Influenza A by PCR NEGATIVE NEGATIVE   Influenza B by PCR NEGATIVE NEGATIVE    Comment: (NOTE) The Xpert Xpress SARS-CoV-2/FLU/RSV plus assay is intended as an aid in the diagnosis of influenza from Nasopharyngeal swab specimens and should not be used as a sole basis for treatment. Nasal washings and aspirates are unacceptable for Xpert Xpress SARS-CoV-2/FLU/RSV testing.  Fact Sheet for Patients: BloggerCourse.com  Fact Sheet for Healthcare Providers: SeriousBroker.it  This test is not yet approved or cleared by the [place] FDA and has been authorized for detection and/or diagnosis of SARS-CoV-2 by FDA under an Emergency Use Authorization (EUA). This EUA will remain in effect (meaning this test can be used) for the duration of the COVID-19 declaration under Section 564(b)(1) of the Act, 21 U.S.C. section 360bbb-3(b)(1), unless the authorization is terminated or revoked.  Performed at [facility], [address], [place], [place] [address]    Personally reviewed- acute appendicitis with dilated appendix with hyperemetic wall and inflammation around it  CT Abdomen Pelvis W Contrast  Result Date: 02/28/2021 CLINICAL DATA:  Right lower quadrant  pain. EXAM: CT ABDOMEN AND PELVIS WITH CONTRAST TECHNIQUE: Multidetector CT imaging of the abdomen and pelvis was performed using the standard protocol following bolus administration of intravenous contrast. CONTRAST:   OMNIPAQUE IOHEXOL 300 MG/ML  SOLN COMPARISON:  CT abdomen pelvis 12/10/2014. FINDINGS: Lower chest: No acute abnormality. Hepatobiliary: No focal liver abnormality. No gallstones, gallbladder wall thickening, or pericholecystic fluid. No biliary dilatation. Pancreas: No focal lesion. Normal pancreatic contour. No surrounding inflammatory changes. No main pancreatic ductal dilatation. Spleen: Normal in size without focal abnormality. Adrenals/Urinary Tract: No adrenal nodule bilaterally. Bilateral kidneys enhance symmetrically. Punctate calcified stone within the right kidney (2:30). No hydronephrosis. No hydroureter. The urinary bladder is unremarkable. Stomach/Bowel: Stomach is within normal limits. No evidence of bowel wall thickening or dilatation. The appendix is enlarged in caliber measuring up to 1 cm. Appendiceal wall thickening and enhancement. No appendiceal wall discontinuity. Associated periappendiceal fat stranding. No appendicolith identified. The appendix is located anteroinferior to the cecum (2:57). Vascular/Lymphatic: No abdominal aorta or iliac aneurysm. No abdominal, pelvic, or inguinal lymphadenopathy. Reproductive: Uterus and bilateral adnexa are unremarkable. Other: No intraperitoneal free fluid. No intraperitoneal free gas. No organized fluid collection. Musculoskeletal: No abdominal wall hernia or abnormality. No suspicious lytic or blastic osseous lesions. No acute displaced fracture. IMPRESSION: Non-perforated acute appendicitis.  No appendicolith. Electronically Signed   By: Tish Frederickson M.D.   On: 02/28/2021 19:33     Assessment & Plan:  Suzanne Horton is a 30 y.o. female with acute appendicitis. She is nervous.   -Discussed the risk of laparoscopic appendectomy and the option of antibiotics alone. Discussed that in [place] and some trials in the [place], antibiotics are used for simple appendicitis. Discussed that research shows a 40% failure rate for antibiotics alone.   Discussed risk of surgery including but not limited to bleeding, infection, injury to other organs, normal appendix, and after this discussion the patient has decided to proceed with surgery.  COVID negative. Works from home.   All questions were answered to the satisfaction of the patient.     Lucretia Roers 03/01/2021, 7:08 AM

## 2021-03-01 NOTE — Anesthesia Procedure Notes (Signed)
Procedure Name: Intubation Date/Time: 03/01/2021 8:43 AM Performed by: Julian Reil, CRNA Pre-anesthesia Checklist: Patient identified, Emergency Drugs available, Suction available and Patient being monitored Patient Re-evaluated:Patient Re-evaluated prior to induction Oxygen Delivery Method: Circle system utilized Preoxygenation: Pre-oxygenation with 100% oxygen Induction Type: IV induction Ventilation: Mask ventilation without difficulty Laryngoscope Size: Miller and 3 Grade View: Grade I Tube type: Oral Tube size: 7.0 mm Number of attempts: 1 Airway Equipment and Method: Stylet Placement Confirmation: ETT inserted through vocal cords under direct vision, positive ETCO2 and breath sounds checked- equal and bilateral Secured at: 22 cm Tube secured with: Tape Dental Injury: Teeth and Oropharynx as per pre-operative assessment  Comments: 4x4s bite block used.

## 2021-03-01 NOTE — Anesthesia Postprocedure Evaluation (Signed)
Anesthesia Post Note  Patient: Suzanne Horton  Procedure(s) Performed: APPENDECTOMY LAPAROSCOPIC  Patient location during evaluation: Phase II Anesthesia Type: General Level of consciousness: awake and alert and oriented Pain management: pain level controlled Vital Signs Assessment: post-procedure vital signs reviewed and stable Respiratory status: spontaneous breathing and respiratory function stable Cardiovascular status: blood pressure returned to baseline and stable Postop Assessment: no apparent nausea or vomiting Anesthetic complications: no   No notable events documented.   Last Vitals:  Vitals:   03/01/21 1030 03/01/21 1100  BP: (!) 93/55 109/66  Pulse: (!) 58 66  Resp: 15 15  Temp:  36.5 C  SpO2: 97% 99%    Last Pain:  Vitals:   03/01/21 1100  TempSrc: Oral  PainSc: 3                  Demarco Bacci C [name]

## 2021-03-01 NOTE — Transfer of Care (Signed)
Immediate Anesthesia Transfer of Care Note  Patient: Suzanne Horton  Procedure(s) Performed: APPENDECTOMY LAPAROSCOPIC  Patient Location: PACU  Anesthesia Type:General  Level of Consciousness: awake  Airway & Oxygen Therapy: Patient Spontanous Breathing and Patient connected to face mask oxygen  Post-op Assessment: Report given to RN and Post -op Vital signs reviewed and stable  Post vital signs: Reviewed and stable  Last Vitals:  Vitals Value Taken Time  BP    Temp    Pulse    Resp 14 03/01/21 0949  SpO2    Vitals shown include unvalidated device data.  Last Pain:  Vitals:   03/01/21 0729  TempSrc: Oral  PainSc: 3       Patients Stated Pain Goal: 4 (03/01/21 0422)  Complications: No notable events documented.

## 2021-03-03 NOTE — Discharge Summary (Signed)
Physician Discharge Summary  Patient ID: Suzanne Horton MRN: [id number] DOB/AGE: 1990/08/15 30 y.o.  Admit date: 02/28/2021 Discharge date: 03/01/2021   Admission Diagnoses: Acute appendicitis   Discharge Diagnoses:  Principal Problem:   Acute appendicitis   Discharged Condition: good  Hospital Course: Ms. Friend is as 9 yo who was admitted with a 1 day history of pain and findings of acute appendicitis on CT. She was taken to the OR in the AM and discharged from the post operative area. She was having adequate pain control, was tolerating a diet, and was ambulating.   Consults: None  Significant Diagnostic Studies: CT with acute appendicitis   Treatments: IV hydration, antibiotics: Zosyn, and laparoscopic appendectomy 03/01/2021  Discharge Exam: Blood pressure 109/66, pulse 66, temperature 97.7 F (36.5 C), temperature source Oral, resp. rate 15, height 5\' 1"  (1.549 m), weight 63.5 kg, last menstrual period 02/15/2021, SpO2 99 %.   Disposition: Discharge disposition: 01-Home or Self Care       Discharge Instructions     Call MD for:  difficulty breathing, headache or visual disturbances   Complete by: As directed    Call MD for:  extreme fatigue   Complete by: As directed    Call MD for:  persistant dizziness or light-headedness   Complete by: As directed    Call MD for:  persistant nausea and vomiting   Complete by: As directed    Call MD for:  redness, tenderness, or signs of infection (pain, swelling, redness, odor or green/yellow discharge around incision site)   Complete by: As directed    Call MD for:  severe uncontrolled pain   Complete by: As directed    Call MD for:  temperature >100.4   Complete by: As directed    Increase activity slowly   Complete by: As directed       Allergies as of 03/01/2021       Reactions   Penicillins Anaphylaxis   Hydromorphone Nausea And Vomiting   Ativan [lorazepam] Other (See Comments)   Makes her feel like she is  not herself        Medication List     TAKE these medications    ondansetron 4 MG disintegrating tablet Commonly known as: ZOFRAN-ODT Take 1 tablet (4 mg total) by mouth every 6 (six) hours as needed for nausea.   oxyCODONE 5 MG immediate release tablet Commonly known as: Oxy IR/ROXICODONE Take 1 tablet (5 mg total) by mouth every 4 (four) hours as needed for severe pain or breakthrough pain.   sertraline 100 MG tablet Commonly known as: ZOLOFT Take 100 mg by mouth every morning. Total of 150mg  taken daily in the morning   sertraline 50 MG tablet Commonly known as: ZOLOFT Take 50 mg by mouth every morning.        Follow-up Information     03/03/2021, MD Follow up on 03/28/2021.   Specialty: General Surgery Why: post op phone call; if you need to be seen in person call the office Contact information: [address] 03/30/2021 [facility] [place] [phone]                 Signed: [id number] 03/03/2021, 11:40 AM

## 2021-03-04 ENCOUNTER — Other Ambulatory Visit: Payer: Self-pay

## 2021-03-04 ENCOUNTER — Telehealth (INDEPENDENT_AMBULATORY_CARE_PROVIDER_SITE_OTHER): Payer: Medicaid Other | Admitting: General Surgery

## 2021-03-04 ENCOUNTER — Encounter (HOSPITAL_COMMUNITY): Payer: Self-pay | Admitting: General Surgery

## 2021-03-04 ENCOUNTER — Emergency Department (HOSPITAL_COMMUNITY)
Admission: EM | Admit: 2021-03-04 | Discharge: 2021-03-05 | Disposition: A | Discharge status: Home / Self Care | Payer: Medicaid Other | Attending: Emergency Medicine | Admitting: Emergency Medicine

## 2021-03-04 DIAGNOSIS — K3580 Unspecified acute appendicitis: Secondary | ICD-10-CM

## 2021-03-04 DIAGNOSIS — Z87891 Personal history of nicotine dependence: Secondary | ICD-10-CM | POA: Insufficient documentation

## 2021-03-04 DIAGNOSIS — R21 Rash and other nonspecific skin eruption: Secondary | ICD-10-CM | POA: Diagnosis present

## 2021-03-04 DIAGNOSIS — G8918 Other acute postprocedural pain: Secondary | ICD-10-CM | POA: Insufficient documentation

## 2021-03-04 DIAGNOSIS — R109 Unspecified abdominal pain: Secondary | ICD-10-CM | POA: Diagnosis not present

## 2021-03-04 LAB — SURGICAL PATHOLOGY

## 2021-03-04 MED ORDER — TRAMADOL HCL 50 MG PO TABS: 50.0000 mg | ORAL_TABLET | Freq: Four times a day (QID) | ORAL | 0 refills | Status: AC | PRN

## 2021-03-04 NOTE — ED Triage Notes (Signed)
Pt here from home with cc of rash since yesterday and post op pain. Friday she has a lap appy with dr bridges

## 2021-03-04 NOTE — Telephone Encounter (Signed)
[facility]  Patient called yesterday about a rash on the upper epigastric region. Above the umbilical port but not extending down.   She had a reported allergy to benadryl as a child? But is unsure what the reaction was as a child. She says she stopped the roxicodone. Rash seems better this am.  Will send tramadol and she can stop the roxicodone. She can take some zofran for nausea. Having some diarrhea but this is her first stool.   Algis Greenhouse, MD [facility] [address], [place] [address] [phone] (office)

## 2021-03-05 LAB — CBC WITH DIFFERENTIAL/PLATELET
Abs Immature Granulocytes: 0.02 10*3/uL (ref 0.00–0.07)
Basophils Absolute: 0 10*3/uL (ref 0.0–0.1)
Basophils Relative: 0 %
Eosinophils Absolute: 0.3 10*3/uL (ref 0.0–0.5)
Eosinophils Relative: 3 %
HCT: 38.2 % (ref 36.0–46.0)
Hemoglobin: 12.7 g/dL (ref 12.0–15.0)
Immature Granulocytes: 0 %
Lymphocytes Relative: 30 %
Lymphs Abs: 2.5 10*3/uL (ref 0.7–4.0)
MCH: 30.9 pg (ref 26.0–34.0)
MCHC: 33.2 g/dL (ref 30.0–36.0)
MCV: 92.9 fL (ref 80.0–100.0)
Monocytes Absolute: 0.4 10*3/uL (ref 0.1–1.0)
Monocytes Relative: 5 %
Neutro Abs: 5.2 10*3/uL (ref 1.7–7.7)
Neutrophils Relative %: 62 %
Platelets: 262 10*3/uL (ref 150–400)
RBC: 4.11 MIL/uL (ref 3.87–5.11)
RDW: 12.9 % (ref 11.5–15.5)
WBC: 8.5 10*3/uL (ref 4.0–10.5)
nRBC: 0 % (ref 0.0–0.2)

## 2021-03-05 LAB — BASIC METABOLIC PANEL
Anion gap: 6 (ref 5–15)
BUN: 13 mg/dL (ref 6–20)
CO2: 25 mmol/L (ref 22–32)
Calcium: 8.7 mg/dL — ABNORMAL LOW (ref 8.9–10.3)
Chloride: 106 mmol/L (ref 98–111)
Creatinine, Ser: 0.65 mg/dL (ref 0.44–1.00)
GFR, Estimated: >60 mL/min (ref >60)
Glucose, Bld: 95 mg/dL (ref 70–99)
Potassium: 3.7 mmol/L (ref 3.5–5.1)
Sodium: 137 mmol/L (ref 135–145)

## 2021-03-05 MED ORDER — DIPHENHYDRAMINE HCL 25 MG PO CAPS
25.0000 mg | ORAL_CAPSULE | Freq: Once | ORAL | Status: AC
  Administered 2021-03-05: 25 mg via ORAL
  Filled 2021-03-05: qty 1

## 2021-03-05 NOTE — Discharge Instructions (Addendum)
The pain you are having is not unusual following the type of operation you had.  Please take the tramadol as needed for pain that is not relieved by acetaminophen or ibuprofen.  He may take any of the over-the-counter antihistamines as needed for itching.  Loratadine (Claritin and cetirizine (Zyrtec are taken once a day.  Diphenhydramine (Benadryl) can be taken every 4-6 hours as needed.  Return if pain is getting worse, you start running a high fever, or start vomiting.

## 2021-03-05 NOTE — ED Provider Notes (Signed)
[facility] EMERGENCY DEPARTMENT Provider Note   CSN: [id number] Arrival date & time: 03/04/21  2221     History Chief Complaint  Patient presents with   Rash   Post-op Problem    Suzanne Horton is a 30 y.o. female.  The history is provided by the patient.  Rash She has history of attention deficit disorder and is status post laparoscopic appendectomy on 7/22 and comes in with left mid abdominal pain and an itchy rash across her abdomen.  The rash started yesterday.  It is not painful and it has not spread to anywhere besides her abdomen.  She was discharged with a prescription for oxycodone and her surgeon switched her to tramadol because of concern of possible allergy.  She denies any difficulty breathing or swallowing.  She rates her abdominal pain at 7/10.   Past Medical History:  Diagnosis Date   ADHD (attention deficit hyperactivity disorder)    Anxiety    Endometriosis     Patient Active Problem List   Diagnosis Date Noted   Acute appendicitis 02/28/2021   KNEE PAIN 03/15/2011   ANKLE PAIN 03/15/2011   FOOT PAIN 03/15/2011    Past Surgical History:  Procedure Laterality Date   LAPAROSCOPIC APPENDECTOMY N/A 03/01/2021   Procedure: APPENDECTOMY LAPAROSCOPIC;  Surgeon: Lucretia Roers, MD;  Location: [facility];  Service: General;  Laterality: N/A;     OB History   No obstetric history on file.     Family History  Problem Relation Age of Onset   Thyroid disease Mother    Hypertension Father     Social History   Tobacco Use   Smoking status: Former    Packs/day: 0.25    Years: 2.00    Pack years: 0.50    Types: Cigarettes  Substance Use Topics   Alcohol use: Not Currently    Comment: "couple of shots every night"   Drug use: Not Currently    Frequency: 3.0 times per week    Types: Marijuana    Home Medications Prior to Admission medications   Medication Sig Start Date End Date Taking? Authorizing Provider  ondansetron (ZOFRAN-ODT) 4 MG  disintegrating tablet Take 1 tablet (4 mg total) by mouth every 6 (six) hours as needed for nausea. 03/01/21   Lucretia Roers, MD  sertraline (ZOLOFT) 100 MG tablet Take 100 mg by mouth every morning. Total of 150mg  taken daily in the morning    [provider]  sertraline (ZOLOFT) 50 MG tablet Take 50 mg by mouth every morning. 12/10/20   [provider]  traMADol (ULTRAM) 50 MG tablet Take 1 tablet (50 mg total) by mouth every 6 (six) hours as needed. 03/04/21   03/06/21, MD    Allergies    Penicillins, Hydromorphone, and Ativan [lorazepam]  Review of Systems   Review of Systems  Skin:  Positive for rash.  All other systems reviewed and are negative.  Physical Exam Updated Vital Signs BP 119/76   Pulse 67   Temp 99.4 F (37.4 C)   Resp 17   Ht 5\' 1"  (1.549 m)   Wt 63.5 kg   LMP 02/15/2021 (Approximate) Comment: negative on 02/28/21  SpO2 99%   BMI 26.45 kg/m   Physical Exam Vitals and nursing note reviewed.  30 year old female, resting comfortably and in no acute distress. Vital signs are normal. Oxygen saturation is 99%, which is normal. Head is normocephalic and atraumatic. PERRLA, EOMI. Oropharynx is clear. Neck is  nontender and supple without adenopathy or JVD. Back is nontender and there is no CVA tenderness. Lungs are clear without rales, wheezes, or rhonchi. Chest is nontender. Heart has regular rate and rhythm without murmur. Abdomen is soft, flat, with mild to moderate tenderness in the left mid abdomen.  There is no rebound or guarding.  Surgical incisions are healing appropriately.  There are no masses or hepatosplenomegaly and peristalsis is normoactive. Extremities have no cyanosis or edema, full range of motion is present. Skin is warm and dry.  Papular rash is present across the abdomen. Neurologic: Mental status is normal, cranial nerves are intact, moves all extremities equally.  ED Results / Procedures / Treatments   Labs (all  labs ordered are listed, but only abnormal results are displayed) Labs Reviewed  BASIC METABOLIC PANEL - Abnormal; Notable for the following components:      Result Value   Calcium 8.7 (*)    All other components within normal limits  CBC WITH DIFFERENTIAL/PLATELET    EKG EKG Interpretation  Date/Time:  Monday March 04 2021 23:20:52 EDT Ventricular Rate:  61 PR Interval:  123 QRS Duration: 99 QT Interval:  413 QTC Calculation: 416 R Axis:   31 Text Interpretation: Sinus rhythm RSR' in V1 or V2, right VCD or RVH Otherwise within normal limits No significant change was found Confirmed by Dione Booze ([id number]) on 03/05/2021 12:27:07 AM  Procedures Procedures   Medications Ordered in ED Medications  diphenhydrAMINE (BENADRYL) capsule 25 mg (has no administration in time range)    ED Course  I have reviewed the triage vital signs and the nursing notes.  Pertinent lab results that were available during my care of the patient were reviewed by me and considered in my medical decision making (see chart for details).   MDM Rules/Calculators/A&P                         Postoperative abdominal pain with fairly benign exam, will check screening labs.  No indication for abdominal imaging today.  Papular rash of uncertain cause, will treat with diphenhydramine.  Old records reviewed confirming recent hospitalization for laparoscopic appendectomy.  ECG is unremarkable.  Labs are normal.  She had good relief of itching with diphenhydramine.  Patient is reassured, told to continue taking the pain medication that she has at home, told the use over-the-counter antihistamines as needed.  Return precautions discussed.  Final Clinical Impression(s) / ED Diagnoses Final diagnoses:  Rash  Post-operative pain    Rx / DC Orders ED Discharge Orders     None        Dione Booze, MD 03/05/21 [phone]

## 2021-03-28 ENCOUNTER — Ambulatory Visit (INDEPENDENT_AMBULATORY_CARE_PROVIDER_SITE_OTHER): Payer: Medicaid Other | Admitting: General Surgery

## 2021-03-28 DIAGNOSIS — K3580 Unspecified acute appendicitis: Secondary | ICD-10-CM

## 2021-03-28 NOTE — Progress Notes (Signed)
[facility]  I am calling the patient for post operative evaluation. This is not a billable encounter as it is under the global charges for the surgery.  The patient had a laparoscopic appendectomy on 7/22. The patient reports that she is doing well. The are tolerating a diet, having good pain control, and having regular Bms.  The incisions are healing but she does have a tip of the suture spitting the umbilicus. She can trim this. The patient has no concerns. Activity and diet as tolerated. Avoid submerging until umbilical incision healed.   Pathology: FINAL MICROSCOPIC DIAGNOSIS:   A. APPENDIX, APPENDECTOMY:  -  Acute appendicitis with serositis and periappendiceal inflammation  Will see the patient PRN.   Algis Greenhouse, MD [facility] [address] [place], [place] [address] [phone] (office)

## 2021-10-15 ENCOUNTER — Ambulatory Visit: Payer: Medicaid Other | Admitting: Nurse Practitioner

## 2021-10-15 DIAGNOSIS — F909 Attention-deficit hyperactivity disorder, unspecified type: Secondary | ICD-10-CM | POA: Insufficient documentation

## 2022-02-22 IMAGING — CT CT ABD-PELV W/ CM
2 of 4 series · 16 of 46 positions shown, 18 images · IV contrast (Omnipaque or Isovue)
Comparison: CT abdomen pelvis 12/10/2014.

CLINICAL DATA: Right lower quadrant pain.

EXAM:
CT ABDOMEN AND PELVIS WITH CONTRAST
TECHNIQUE: Multidetector CT imaging of the abdomen and pelvis was performed
using the standard protocol following bolus administration of
intravenous contrast.
CONTRAST:  100mL OMNIPAQUE IOHEXOL 300 MG/ML  SOLN

[Series 2: axial st · axial · 0.69mm/px · z∈[+971,+1396]mm · 13 of 93 slices shown, 15 images]
[im 4/93  soft-tissue]
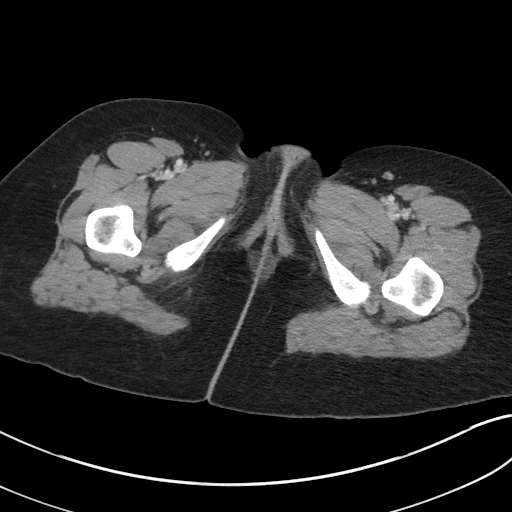
[im 4/93  bone]
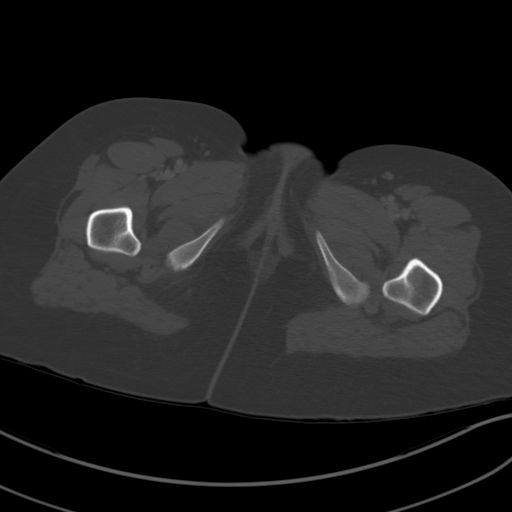
[im 12/93  soft-tissue]
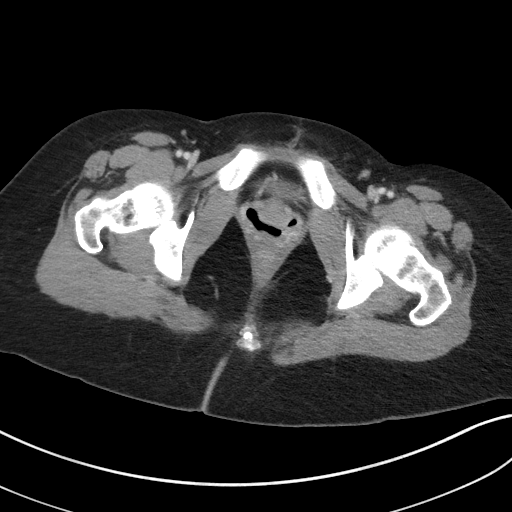
[im 20/93  soft-tissue]
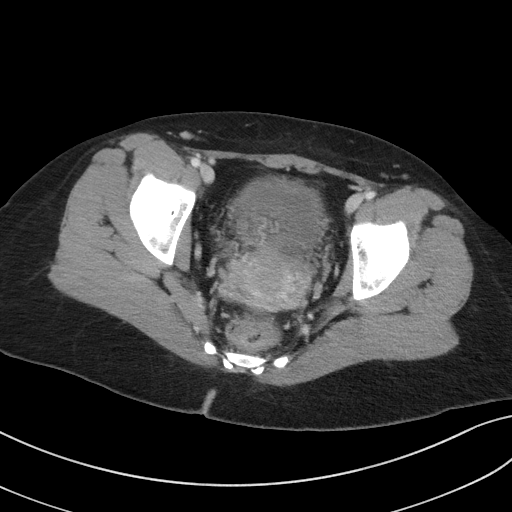
[im 27/93  soft-tissue]
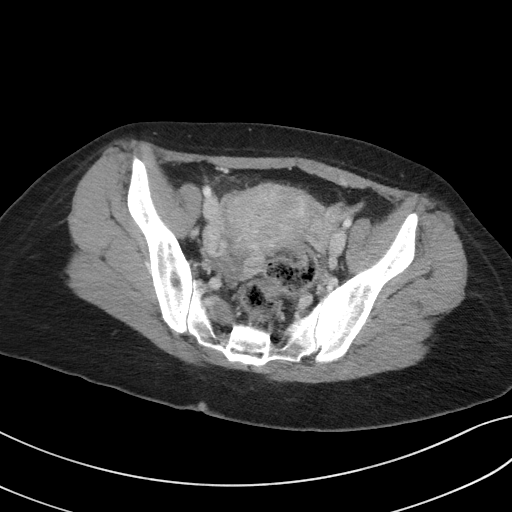
[im 31/93  soft-tissue]
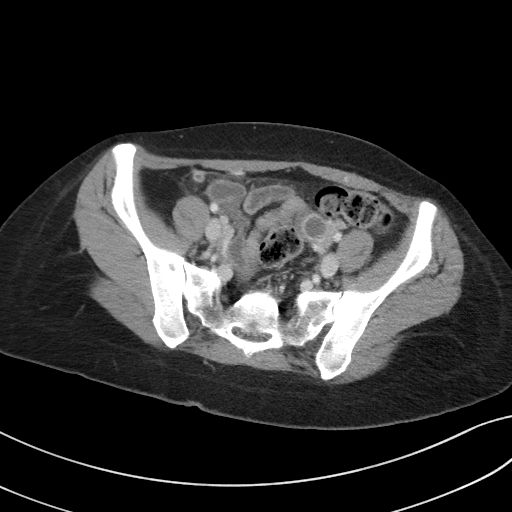
[im 39/93  soft-tissue]
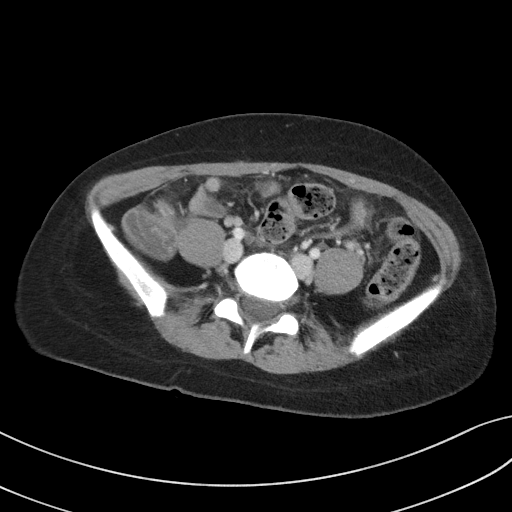
[im 47/93  soft-tissue]
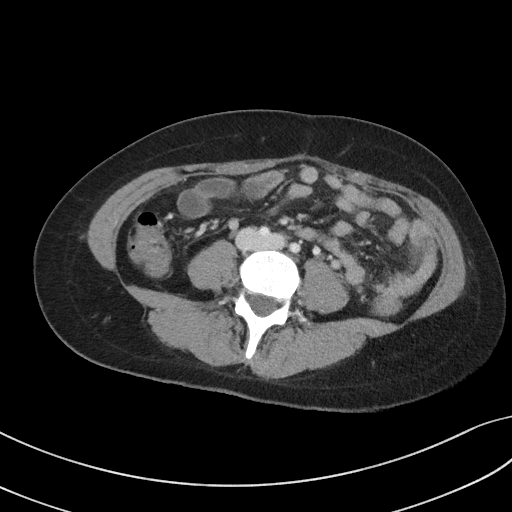
[im 54/93  soft-tissue]
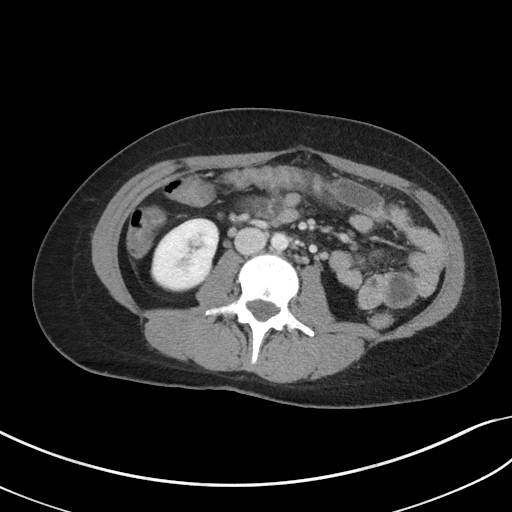
[im 62/93  soft-tissue]
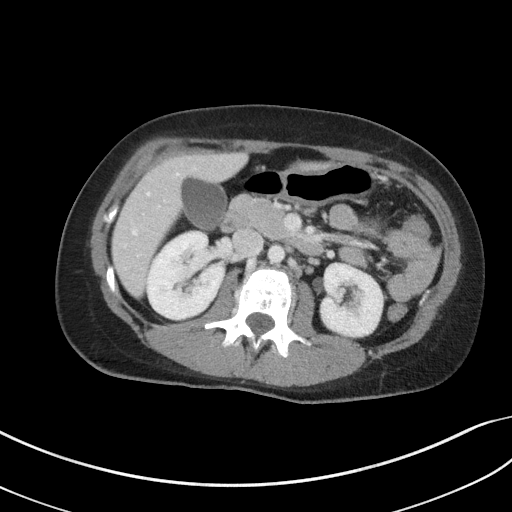
[im 62/93  bone]
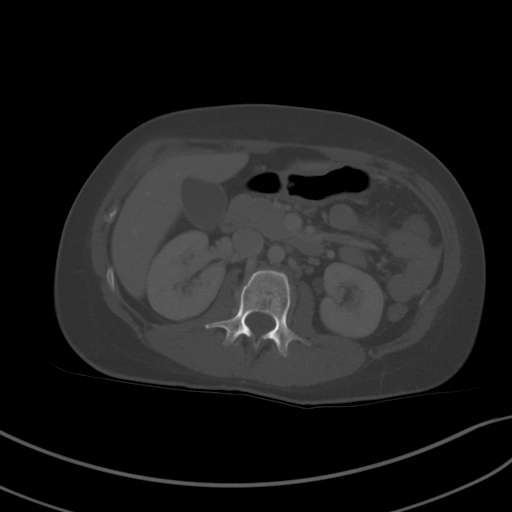
[im 66/93  soft-tissue]
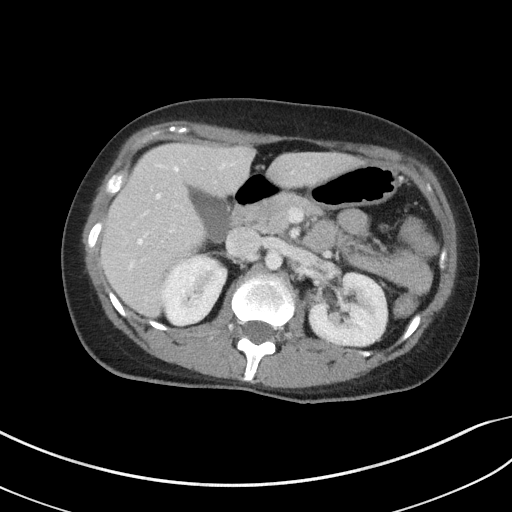
[im 73/93  soft-tissue]
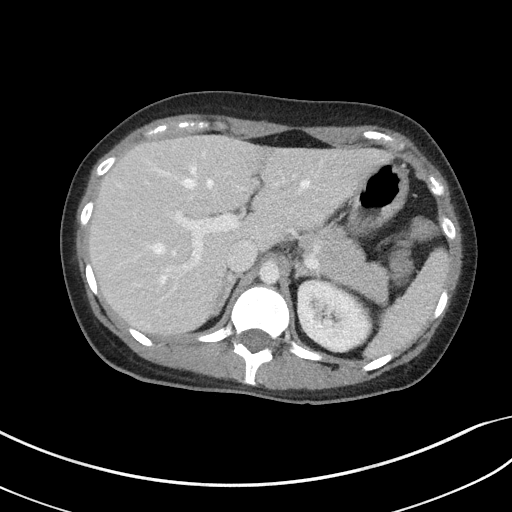
[im 81/93  soft-tissue]
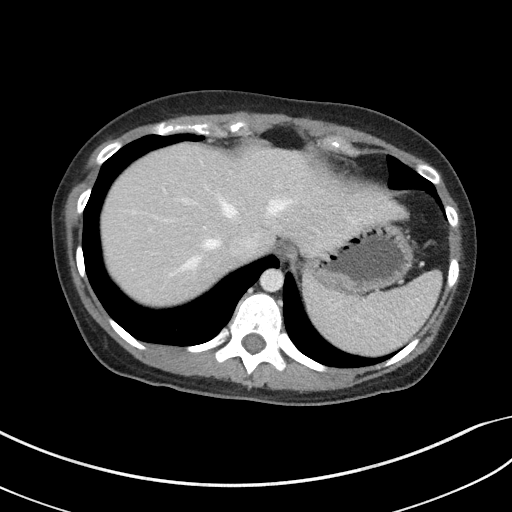
[im 89/93  soft-tissue]
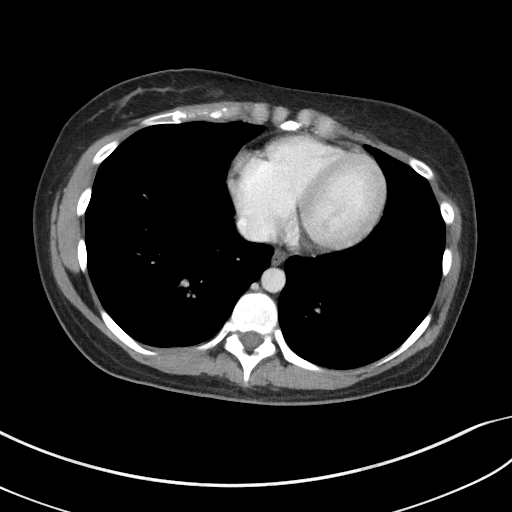

[Series 5: coronal st · coronal · 0.68mm/px · 3 of 80 slices shown]
[im 27/80  soft-tissue]
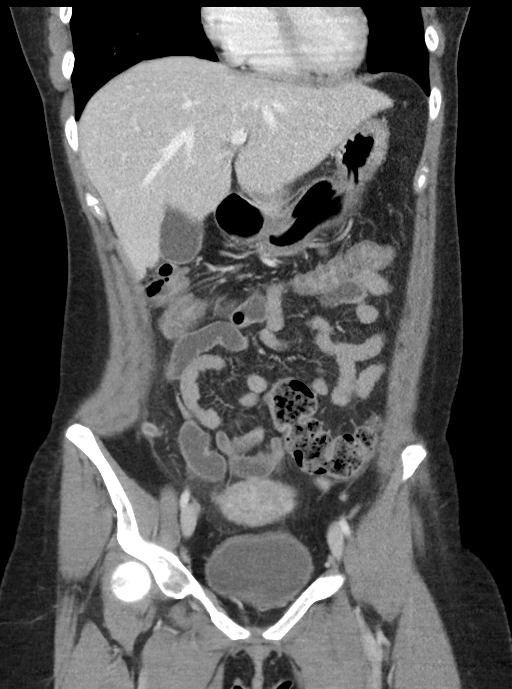
[im 36/80  soft-tissue]
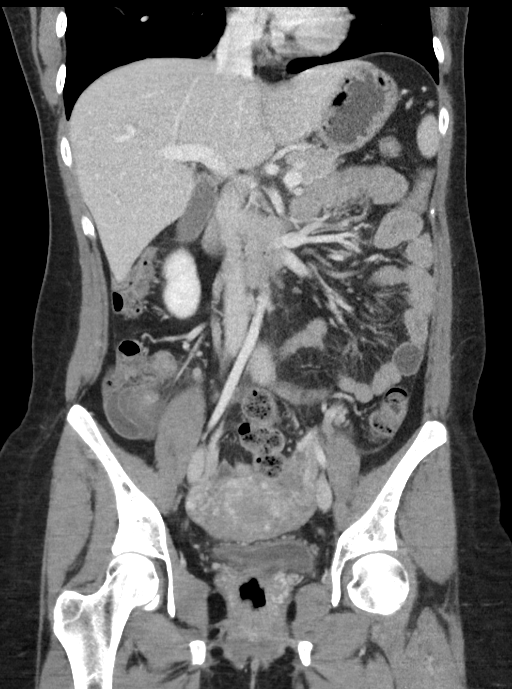
[im 44/80  soft-tissue]
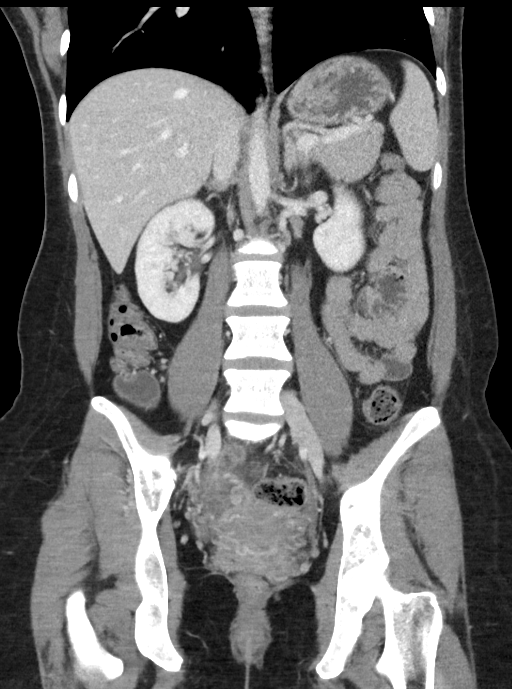

[16 of 46 positions shown; findings below may reference images not displayed]

FINDINGS: Lower chest: No acute abnormality.

Hepatobiliary: No focal liver abnormality. No gallstones,
gallbladder wall thickening, or pericholecystic fluid. No biliary
dilatation.

Pancreas: No focal lesion. Normal pancreatic contour. No surrounding
inflammatory changes. No main pancreatic ductal dilatation.

Spleen: Normal in size without focal abnormality.

Adrenals/Urinary Tract:

No adrenal nodule bilaterally.

Bilateral kidneys enhance symmetrically. Punctate calcified stone
within the right kidney ([DATE]). No hydronephrosis. No hydroureter.

The urinary bladder is unremarkable.

Stomach/Bowel: Stomach is within normal limits. No evidence of bowel
wall thickening or dilatation. The appendix is enlarged in caliber
measuring up to 1 cm. Appendiceal wall thickening and enhancement.
No appendiceal wall discontinuity. Associated periappendiceal fat
stranding. No appendicolith identified. The appendix is located
anteroinferior to the cecum ([DATE]).

Vascular/Lymphatic: No abdominal aorta or iliac aneurysm. No
abdominal, pelvic, or inguinal lymphadenopathy.

Reproductive: Uterus and bilateral adnexa are unremarkable.

Other: No intraperitoneal free fluid. No intraperitoneal free gas.
No organized fluid collection.

Musculoskeletal:

No abdominal wall hernia or abnormality.

No suspicious lytic or blastic osseous lesions. No acute displaced
fracture.
IMPRESSION: Non-perforated acute appendicitis.  No appendicolith.

## 2023-04-27 ENCOUNTER — Other Ambulatory Visit: Payer: Self-pay

## 2023-04-27 ENCOUNTER — Emergency Department (HOSPITAL_COMMUNITY)
Admission: EM | Admit: 2023-04-27 | Discharge: 2023-04-27 | Discharge status: Against Medical Advice | Payer: Medicaid Other | Attending: Emergency Medicine | Admitting: Emergency Medicine

## 2023-04-27 ENCOUNTER — Encounter (HOSPITAL_COMMUNITY): Payer: Self-pay | Admitting: Emergency Medicine

## 2023-04-27 DIAGNOSIS — Z5321 Procedure and treatment not carried out due to patient leaving prior to being seen by health care provider: Secondary | ICD-10-CM | POA: Diagnosis not present

## 2023-04-27 DIAGNOSIS — R300 Dysuria: Secondary | ICD-10-CM | POA: Insufficient documentation

## 2023-04-27 DIAGNOSIS — R350 Frequency of micturition: Secondary | ICD-10-CM | POA: Diagnosis not present

## 2023-04-27 NOTE — ED Triage Notes (Signed)
Pt presents with urinary frequency and burning x 1 day.
